# Patient Record
Sex: Female | Born: 1970 | Race: White | Hispanic: No | Marital: Married | Smoking: Former smoker
Health system: Southern US, Community
[De-identification: ages and names within clinical notes are randomized; demographics above are authoritative.]

## PROBLEM LIST (undated history)

## (undated) DIAGNOSIS — I10 Essential (primary) hypertension: Secondary | ICD-10-CM

## (undated) DIAGNOSIS — A6000 Herpesviral infection of urogenital system, unspecified: Secondary | ICD-10-CM

## (undated) DIAGNOSIS — E781 Pure hyperglyceridemia: Secondary | ICD-10-CM

## (undated) DIAGNOSIS — D1800 Hemangioma unspecified site: Secondary | ICD-10-CM

## (undated) DIAGNOSIS — K760 Fatty (change of) liver, not elsewhere classified: Secondary | ICD-10-CM

## (undated) HISTORY — DX: Fatty (change of) liver, not elsewhere classified: K76.0

## (undated) HISTORY — DX: Herpesviral infection of urogenital system, unspecified: A60.00

## (undated) HISTORY — DX: Pure hyperglyceridemia: E78.1

## (undated) HISTORY — DX: Hemangioma unspecified site: D18.00

## (undated) HISTORY — DX: Essential (primary) hypertension: I10

## (undated) HISTORY — PX: FINGER SURGERY: SHX640

---
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
## 2006-05-04 HISTORY — PX: TUBAL LIGATION: SHX77

## 2009-03-12 ENCOUNTER — Ambulatory Visit: Payer: Self-pay | Admitting: Unknown Physician Specialty

## 2009-10-27 LAB — HM PAP SMEAR: HM Pap smear: NORMAL

## 2010-07-18 ENCOUNTER — Ambulatory Visit: Payer: Self-pay | Admitting: Unknown Physician Specialty

## 2011-01-12 ENCOUNTER — Encounter: Payer: Self-pay | Admitting: Internal Medicine

## 2011-01-12 ENCOUNTER — Ambulatory Visit (INDEPENDENT_AMBULATORY_CARE_PROVIDER_SITE_OTHER): Payer: 59 | Admitting: Internal Medicine

## 2011-01-12 VITALS — BP 129/86 | HR 74 | Temp 98.6°F | Ht 67.0 in | Wt 232.5 lb

## 2011-01-12 DIAGNOSIS — R591 Generalized enlarged lymph nodes: Secondary | ICD-10-CM

## 2011-01-12 DIAGNOSIS — E669 Obesity, unspecified: Secondary | ICD-10-CM

## 2011-01-12 DIAGNOSIS — Z0000 Encounter for general adult medical examination without abnormal findings: Secondary | ICD-10-CM

## 2011-01-12 DIAGNOSIS — H60399 Other infective otitis externa, unspecified ear: Secondary | ICD-10-CM

## 2011-01-12 DIAGNOSIS — R599 Enlarged lymph nodes, unspecified: Secondary | ICD-10-CM

## 2011-01-12 DIAGNOSIS — H6090 Unspecified otitis externa, unspecified ear: Secondary | ICD-10-CM

## 2011-01-12 MED ORDER — CIPROFLOXACIN-DEXAMETHASONE 0.3-0.1 % OT SUSP: 4.0000 [drp] | Freq: Two times a day (BID) | OTIC | Status: AC

## 2011-01-12 NOTE — Patient Instructions (Signed)
Lymphadenopathy (Swollen Lymph Glands) Lymphadenopathy means "disease of the lymph glands." But the term is usually used to describe swollen or enlarged lymph glands, also called lymph nodes. These are the bean-shaped organs found in many locations including the neck, underarm, and groin. Lymph glands are part of the immune system, which fights infections in your body. Lymphadenopathy can occur in just one area of the body, such as the neck, or it can be generalized, with lymph node enlargement in several areas. The nodes found in the neck are the most common sites of lymphadenopathy. CAUSES  When your immune system responds to germs (such as viruses or bacteria ), infection-fighting cells and fluid build up. This causes the glands to grow in size. This is usually not something to worry about. Sometimes, the glands themselves can become infected and inflamed. This is called lymphadenitis. Enlarged lymph nodes can be caused by many diseases:  Bacterial disease, such as strep throat or a skin infection.   Viral disease, such as a common cold.   Other germs, such as lyme disease, tuberculosis, or sexually transmitted diseases.   Cancers, such as lymphoma (cancer of the lymphatic system) or leukemia (cancer of the white blood cells).   Inflammatory diseases such as lupus or rheumatoid arthritis.   Reactions to medications.  Many of the diseases above are rare, but important. This is why you should see your caregiver if you have lymphadenopathy. SYMPTOMS  Swollen, enlarged lumps in the neck, back of the head or other locations.   Tenderness.   Warmth or redness of the skin over the lymph nodes.   Fever.  DIAGNOSIS Enlarged lymph nodes are often near the source of infection. They can help healthcare providers diagnose your illness. For instance:   Swollen lymph nodes around the jaw might be caused by an infection in the mouth.   Enlarged glands in the neck often signal a throat infection.     Lymph nodes that are swollen in more than one area often indicate an illness caused by a virus.  Your caregiver most likely will know what is causing your lymphadenopathy after listening to your history and examining you. Blood tests, x-rays or other tests may be needed. If the cause of the enlarged lymph node cannot be found, and it does not go away by itself, then a biopsy may be needed. Your caregiver will discuss this with you. TREATMENT Treatment for your enlarged lymph nodes will depend on the cause. Many times the nodes will shrink to normal size by themselves, with no treatment. Antibiotics or other medicines may be needed for infection. Only take over-the-counter or prescription medicines for pain, discomfort or fever as directed by your caregiver. HOME CARE INSTRUCTIONS Swollen lymph glands usually return to normal when the underlying medical condition goes away. If they persist, contact your health-care provider. He/she might prescribe antibiotics or other treatments, depending on the diagnosis. Take any medications exactly as prescribed. Keep any follow-up appointments made to check on the condition of your enlarged nodes.  SEEK MEDICAL CARE IF:  Swelling lasts for more than two weeks.   You have symptoms such as weight loss, night sweats, fatigue or fever that does not go away.   The lymph nodes are hard, seem fixed to the skin or are growing rapidly.   Skin over the lymph nodes is red and inflamed. This could mean there is an infection.  SEEK IMMEDIATE MEDICAL CARE IF:  Fluid starts leaking from the area of the enlarged lymph   node.   You develop a fever of 102 F (38.9 C) or greater.   Severe pain develops (not necessarily at the site of a large lymph node).   You develop chest pain or shortness of breath.   You develop worsening abdominal pain.  MAKE SURE YOU:   Understand these instructions.   Will watch your condition.   Will get help right away if you are not  doing well or get worse.  Document Released: 01/28/2008 Document Re-Released: 05/12/2009 [employer] Patient Information 2011 [employer]. 

## 2011-01-12 NOTE — Progress Notes (Signed)
Subjective:    Patient ID: Rhonda Kirby, female    DOB: [birth year], 40 y.o.   MRN: [id number]  HPI Rhonda Kirby is a 40 year old female who presents for an acute visit complaining of right neck lymphadenopathy. She reports that she noticed a swelling in her right neck last Thursday. She reports that this area became very tender to touch on Friday. She reports that all day Friday she felt poorly with general malaise, however she had no specific symptoms such as sore throat, ear pain, fever, cough. Over the weekend, she felt much better. She reports that the lymph node gradually decreased in size. She can no longer palpate the lymph node. She denies any other symptoms such as night sweats, weight loss, fatigue.  She is concerned over inability to lose weight. She notes difficulty losing weight since the birth of her last child 4 years ago. She reports attempts at weight loss including using weight watchers program. She reports some limited physical activity walking. She notes limitation on her free time for exercise because of responsibilities both at work and caring for her children.  Outpatient Encounter Prescriptions as of 01/12/2011  Medication Sig Dispense Refill  . ciprofloxacin-dexamethasone (CIPRODEX) otic suspension Place 4 drops into the right ear 2 (two) times daily.  7.5 mL  0    Review of Systems  Constitutional: Negative for fever, chills, appetite change, fatigue and unexpected weight change.  HENT: Positive for ear pain (itching right ear canal) and ear discharge. Negative for hearing loss, nosebleeds, congestion, sore throat, facial swelling, rhinorrhea, sneezing, mouth sores, trouble swallowing, neck pain, neck stiffness, voice change, postnasal drip, sinus pressure and tinnitus.   Eyes: Negative for pain, discharge, redness and visual disturbance.  Respiratory: Negative for cough, chest tightness, shortness of breath, wheezing and stridor.   Cardiovascular: Negative for chest pain,  palpitations and leg swelling.  Gastrointestinal: Negative for nausea, vomiting, abdominal pain, diarrhea and constipation.  Genitourinary: Negative for dysuria and flank pain.  Musculoskeletal: Negative for myalgias, arthralgias and gait problem.  Skin: Negative for color change and rash.  Neurological: Negative for dizziness, weakness, light-headedness and headaches.  Hematological: Positive for adenopathy (right anterior neck, now resolved). Does not bruise/bleed easily.  Psychiatric/Behavioral: Negative for suicidal ideas, sleep disturbance and dysphoric mood. The patient is not nervous/anxious.    BP 129/86  Pulse 74  Temp(Src) 98.6 F (37 C) (Oral)  Ht 5\' 7"  (1.702 m)  Wt 232 lb 8 oz (105.461 kg)  BMI 36.41 kg/m2  SpO2 99%  LMP 12/18/2010     Objective:   Physical Exam  Constitutional: She is oriented to person, place, and time. She appears well-developed and well-nourished. No distress.  HENT:  Head: Normocephalic and atraumatic.  Right Ear: Hearing and tympanic membrane normal. There is drainage.  Left Ear: Hearing, tympanic membrane, external ear and ear canal normal.  Nose: Nose normal.  Mouth/Throat: Oropharynx is clear and moist. No oropharyngeal exudate or posterior oropharyngeal erythema.  Eyes: Conjunctivae are normal. Pupils are equal, round, and reactive to light. Right eye exhibits no discharge. Left eye exhibits no discharge. No scleral icterus.  Neck: Normal range of motion. Neck supple. No tracheal deviation present. No thyromegaly present.  Cardiovascular: Normal rate, regular rhythm, normal heart sounds and intact distal pulses.  Exam reveals no gallop and no friction rub.   No murmur heard. Pulmonary/Chest: Effort normal and breath sounds normal. No respiratory distress. She has no wheezes. She has no rales. She exhibits no tenderness.  Musculoskeletal:  Normal range of motion. She exhibits no edema and no tenderness.  Lymphadenopathy:       Head (right  side): No submental, no submandibular, no preauricular, no posterior auricular and no occipital adenopathy present.       Head (left side): No submental, no submandibular, no tonsillar, no preauricular, no posterior auricular and no occipital adenopathy present.    She has cervical adenopathy.       Right cervical: Superficial cervical adenopathy present. No deep cervical and no posterior cervical adenopathy present.      Left cervical: No superficial cervical, no deep cervical and no posterior cervical adenopathy present.    She has no axillary adenopathy.       Soft, freely mobile LAD over right SCM in anterior cervical chain  Neurological: She is alert and oriented to person, place, and time. No cranial nerve deficit. She exhibits normal muscle tone. Coordination normal.  Skin: Skin is warm and dry. No rash noted. She is not diaphoretic. No erythema. No pallor.  Psychiatric: She has a normal mood and affect. Her behavior is normal. Judgment and thought content normal.          Assessment & Plan:  1. Right anterior cervical LAD - patient with recent history of right anterior cervical lymphadenopathy, which by her report seems to be improving. Suspect this was related to viral infection giving her symptoms on Friday. Exam today is remarkable only for a single subcentimeter node in anterior cervical chain which is palpable just superficial to the sternocleidomastoid. We'll continue to monitor at this lymph node. If it increases in size, becomes painful, or she develops new symptoms such as fever, night sweats, or malaise we will plan to investigate further with a CT of the neck were lymph node biopsy.  2. Otitis externa - patient has several month history of drainage and ear rotation in her right ear canal. Exam today is remarkable for erythema and ear canal with some purulent drainage. Will try treating with Ciprodex. If she has no improvement with this over the next week then we will set her up  with ENT for culture and evaluation.  3. Obesity - patient notes inability to lose weight. BMI elevated at 36. We discussed keeping a food diary. We also discussed limiting calories to 1200 per day. We discussed increasing intake of whole foods, including fruits and vegetables. I gave her some references for healthy recipes. We will plan to repeat her lab work including cholesterol in February 2012. She plans to make a concerted effort at diet and exercise prior to her physical exam in February.

## 2011-02-11 ENCOUNTER — Encounter: Payer: Self-pay | Admitting: Internal Medicine

## 2011-02-24 ENCOUNTER — Ambulatory Visit (INDEPENDENT_AMBULATORY_CARE_PROVIDER_SITE_OTHER): Payer: 59 | Admitting: Internal Medicine

## 2011-02-24 ENCOUNTER — Encounter: Payer: Self-pay | Admitting: Internal Medicine

## 2011-02-24 VITALS — BP 122/78 | HR 69 | Temp 98.6°F | Resp 10

## 2011-02-24 DIAGNOSIS — J329 Chronic sinusitis, unspecified: Secondary | ICD-10-CM

## 2011-02-24 MED ORDER — AMOXICILLIN-POT CLAVULANATE 875-125 MG PO TABS: 1.0000 | ORAL_TABLET | Freq: Two times a day (BID) | ORAL | Status: AC

## 2011-02-24 NOTE — Progress Notes (Signed)
Subjective:    Patient ID: Rhonda Kirby, female    DOB: 1970-08-25, 40 y.o.   MRN: [id number]  HPI Rhonda Kirby is a 40 year old female who presents for an acute visit complaining of ear pain and nasal congestion. She notes that her symptoms began approximately 2 weeks ago. She reports nasal drainage and facial pressure. She denies any fever or chills. She has tried using over-the-counter decongestants and pain medicines with no improvement. She occasionally has some dizziness described as vertigo. She has several sick contacts.  Outpatient Encounter Prescriptions as of 02/24/2011  Medication Sig Dispense Refill  . amoxicillin-clavulanate (AUGMENTIN) 875-125 MG per tablet Take 1 tablet by mouth 2 (two) times daily.  20 tablet  0    Review of Systems  Constitutional: Negative for fever, chills, appetite change, fatigue and unexpected weight change.  HENT: Positive for ear pain, congestion and sinus pressure. Negative for sore throat, trouble swallowing, neck pain, voice change and ear discharge.   Eyes: Negative for visual disturbance.  Respiratory: Negative for cough, shortness of breath, wheezing and stridor.   Cardiovascular: Negative for chest pain, palpitations and leg swelling.  Gastrointestinal: Negative for nausea, vomiting, abdominal pain, diarrhea, constipation, blood in stool, abdominal distention and anal bleeding.  Musculoskeletal: Negative for myalgias, arthralgias and gait problem.  Skin: Negative for color change and rash.  Neurological: Positive for dizziness and headaches.  Hematological: Negative for adenopathy. Does not bruise/bleed easily.  Psychiatric/Behavioral: Negative for suicidal ideas, sleep disturbance and dysphoric mood. The patient is not nervous/anxious.    BP 122/78  Pulse 69  Temp 98.6 F (37 C)  Resp 10  SpO2 97%     Objective:   Physical Exam  Constitutional: She is oriented to person, place, and time. She appears well-developed and well-nourished.  No distress.  HENT:  Head: Normocephalic and atraumatic.  Right Ear: External ear normal. Tympanic membrane is not erythematous. A middle ear effusion is present.  Left Ear: External ear normal. Tympanic membrane is not erythematous.  Nose: Nose normal. Right sinus exhibits no maxillary sinus tenderness and no frontal sinus tenderness. Left sinus exhibits no maxillary sinus tenderness and no frontal sinus tenderness.  Mouth/Throat: Oropharynx is clear and moist. No oropharyngeal exudate or posterior oropharyngeal erythema.  Eyes: Conjunctivae are normal. Pupils are equal, round, and reactive to light. Right eye exhibits no discharge. Left eye exhibits no discharge. No scleral icterus.  Neck: Normal range of motion. Neck supple. No tracheal deviation present. No thyromegaly present.  Cardiovascular: Normal rate, regular rhythm, normal heart sounds and intact distal pulses.  Exam reveals no gallop and no friction rub.   No murmur heard. Pulmonary/Chest: Effort normal and breath sounds normal. No respiratory distress. She has no wheezes. She has no rales. She exhibits no tenderness.  Musculoskeletal: Normal range of motion. She exhibits no edema and no tenderness.  Lymphadenopathy:    She has no cervical adenopathy.  Neurological: She is alert and oriented to person, place, and time. No cranial nerve deficit. She exhibits normal muscle tone. Coordination normal.  Skin: Skin is warm and dry. No rash noted. She is not diaphoretic. No erythema. No pallor.  Psychiatric: She has a normal mood and affect. Her behavior is normal. Judgment and thought content normal.          Assessment & Plan:  1. Sinusitis - symptoms are most consistent with acute maxillary sinusitis. Will treat with Augmentin x10 days. She will continue to use ibuprofen to help with inflammation and pain.  Patient will call or return to clinic if symptoms are not improving over the next week.

## 2011-03-05 ENCOUNTER — Ambulatory Visit: Payer: 59 | Admitting: Internal Medicine

## 2011-03-11 ENCOUNTER — Encounter: Payer: Self-pay | Admitting: Internal Medicine

## 2011-03-11 ENCOUNTER — Ambulatory Visit (INDEPENDENT_AMBULATORY_CARE_PROVIDER_SITE_OTHER): Payer: 59 | Admitting: Internal Medicine

## 2011-03-11 VITALS — BP 132/62 | HR 79 | Temp 98.4°F | Wt 232.0 lb

## 2011-03-11 DIAGNOSIS — F419 Anxiety disorder, unspecified: Secondary | ICD-10-CM

## 2011-03-11 DIAGNOSIS — F411 Generalized anxiety disorder: Secondary | ICD-10-CM

## 2011-03-11 DIAGNOSIS — N920 Excessive and frequent menstruation with regular cycle: Secondary | ICD-10-CM

## 2011-03-11 MED ORDER — ALPRAZOLAM 0.25 MG PO TABS: 0.2500 mg | ORAL_TABLET | Freq: Three times a day (TID) | ORAL | Status: AC | PRN

## 2011-03-11 NOTE — Patient Instructions (Signed)
[facility] - Uterine ablation  Tad Moore - Relaxation Expert

## 2011-03-11 NOTE — Progress Notes (Signed)
Subjective:    Patient ID: Rhonda Kirby, female    DOB: 06-21-1970, 40 y.o.   MRN: [id number]  HPI 40 year old female presents for an acute visit with a concern of anxiety. She notes that over the course of her life she has had a couple of episodes of anxiety. First, during puberty and then secondly when she was pregnant with her children. She notes that during her pregnancy she used Zoloft with significant improvement in her anxiety. She reports that over the last few weeks she has developed episodes of panic attacks which have woken her from her sleep. During these episodes she feels tightness in her chest and sense of extreme anxiety and nervousness. She has been trying to control the episodes with use of meditation and relaxation techniques. She notes significant anxiety that she will have recurrent attacks. She denies any recent changes at home or at work to precipitate these events. She notes some dysphoric mood but describes as mild. She has been able to complete her activities of daily living and is functioning normally. She questions whether this recent increase in anxiety may be related to hormonal changes associated with menopause. She notes that her periods continue to be regular however they are significantly heavier than previously.  Outpatient Encounter Prescriptions as of 03/11/2011  Medication Sig Dispense Refill  . ALPRAZolam (XANAX) 0.25 MG tablet Take 1 tablet (0.25 mg total) by mouth 3 (three) times daily as needed for anxiety.  90 tablet  0     Review of Systems  Constitutional: Negative for fever, chills, appetite change, fatigue and unexpected weight change.  HENT: Negative for ear pain, congestion, sore throat, neck pain and sinus pressure.   Eyes: Negative for visual disturbance.  Respiratory: Negative for cough and shortness of breath.   Cardiovascular: Negative for chest pain, palpitations and leg swelling.  Genitourinary: Negative for dysuria and flank pain.    Musculoskeletal: Negative for myalgias, arthralgias and gait problem.  Skin: Negative for color change and rash.  Neurological: Negative for dizziness and headaches.  Hematological: Negative for adenopathy. Does not bruise/bleed easily.  Psychiatric/Behavioral: Negative for suicidal ideas, sleep disturbance and dysphoric mood. The patient is nervous/anxious.    BP 132/62  Pulse 79  Temp(Src) 98.4 F (36.9 C) (Oral)  Wt 232 lb (105.235 kg)  SpO2 98%     Objective:   Physical Exam  Constitutional: She is oriented to person, place, and time. She appears well-developed and well-nourished. No distress.  HENT:  Head: Normocephalic and atraumatic.  Right Ear: Tympanic membrane and external ear normal.  Left Ear: Tympanic membrane and external ear normal.  Nose: Nose normal.  Mouth/Throat: Oropharynx is clear and moist. No oropharyngeal exudate.  Eyes: Conjunctivae are normal. Pupils are equal, round, and reactive to light. Right eye exhibits no discharge. Left eye exhibits no discharge. No scleral icterus.  Neck: Normal range of motion. Neck supple. No tracheal deviation present. No thyromegaly present.  Cardiovascular: Normal rate, regular rhythm, normal heart sounds and intact distal pulses.  Exam reveals no gallop and no friction rub.   No murmur heard. Pulmonary/Chest: Effort normal and breath sounds normal. No respiratory distress. She has no wheezes. She has no rales. She exhibits no tenderness.  Musculoskeletal: Normal range of motion. She exhibits no edema and no tenderness.  Lymphadenopathy:    She has no cervical adenopathy.  Neurological: She is alert and oriented to person, place, and time. No cranial nerve deficit. She exhibits normal muscle tone. Coordination normal.  Skin: Skin is warm and dry. No rash noted. She is not diaphoretic. No erythema. No pallor.  Psychiatric: She has a normal mood and affect. Her behavior is normal. Judgment and thought content normal.           Assessment & Plan:  1. Anxiety/Panic Attacks - Will start alprazolam as needed. Pt will call if symptoms not controlled with use of prn alprazolam. We discussed use of SSRIs, but will hold off on starting for now.  Gave some references for relaxation. Follow up 1 month.  2. Menorrhagia - Discussed intervention including novosure. She will discuss this with OB.

## 2011-04-14 ENCOUNTER — Ambulatory Visit: Payer: 59 | Admitting: Internal Medicine

## 2011-06-24 ENCOUNTER — Telehealth: Payer: Self-pay | Admitting: Internal Medicine

## 2011-06-24 DIAGNOSIS — Z1231 Encounter for screening mammogram for malignant neoplasm of breast: Secondary | ICD-10-CM

## 2011-06-24 NOTE — Telephone Encounter (Signed)
[phone]  Pt called her ob is retiring who would you recommend for her to go to  She also needs order to go to [facility] for mammogram dr Jonni Sanger usually orders this

## 2011-06-25 NOTE — Telephone Encounter (Signed)
We would be happy to see her for PAP smears, or I would recommend Dr. Greggory Keen at [facility].  We can order screening mammogram.

## 2011-06-29 ENCOUNTER — Other Ambulatory Visit: Payer: 59

## 2011-06-29 IMAGING — US ULTRASOUND RIGHT BREAST
1 series · 8 of 8 positions shown · non-contrast
Comparison: none

REASON FOR EXAM: right breast pain at 11 oclock    baseline
COMMENTS:

PROCEDURE:     US  - US BREAST RIGHT  - March 12, 2009  [DATE]
RESULT:     No cystic or solid abnormalities are identified. A negative
mammogram and ultrasound does not preclude biopsy if a palpable lesion is
present.

[Series 1: ultrasound right breast · 8 of 8 slices shown]
[im 1/8]
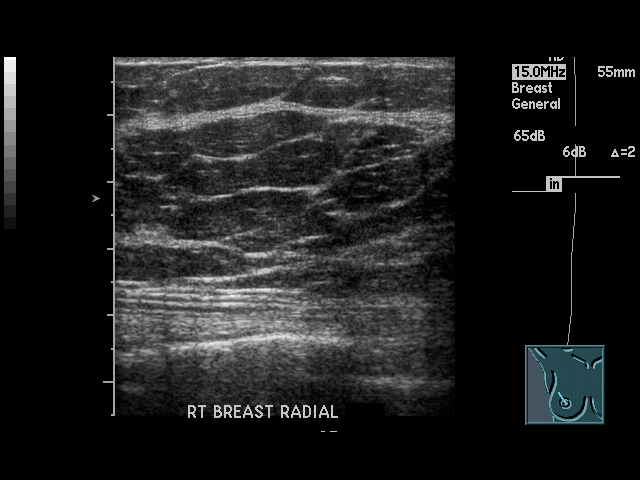
[im 2/8]
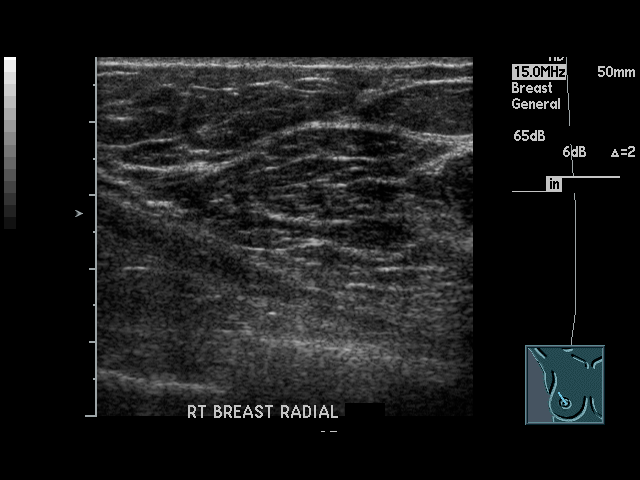
[im 3/8]
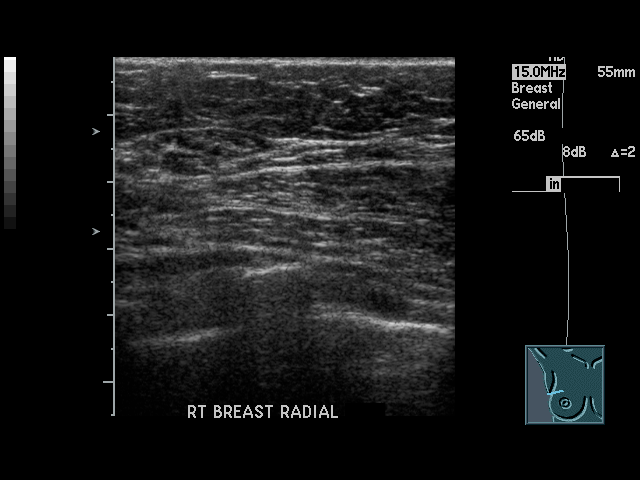
[im 4/8]
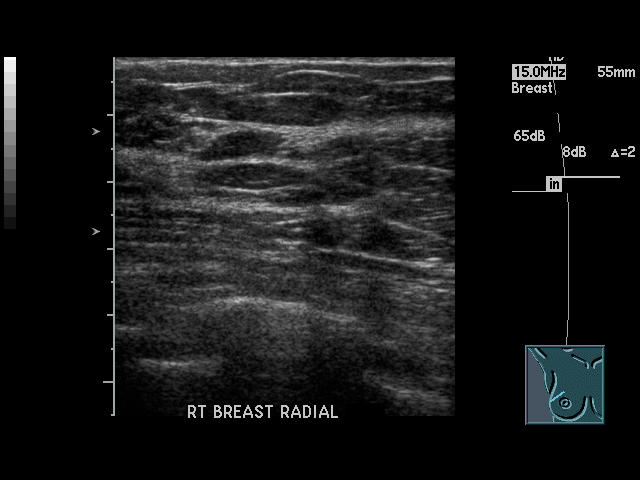
[im 5/8]
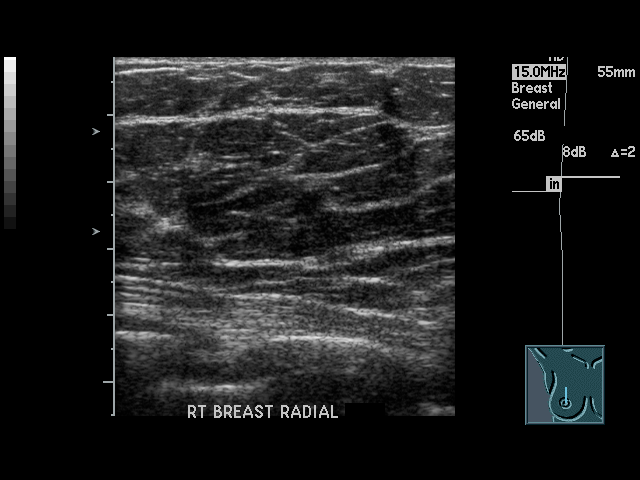
[im 6/8]
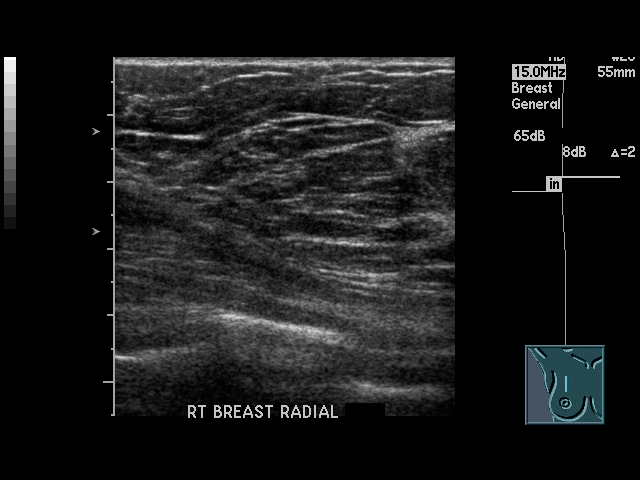
[im 7/8]
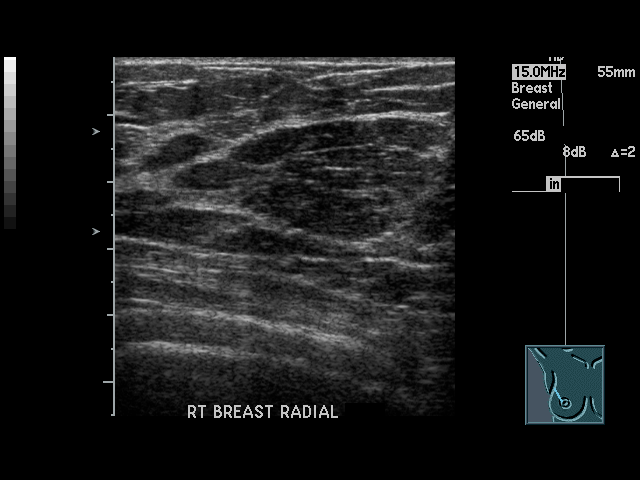
[im 8/8]
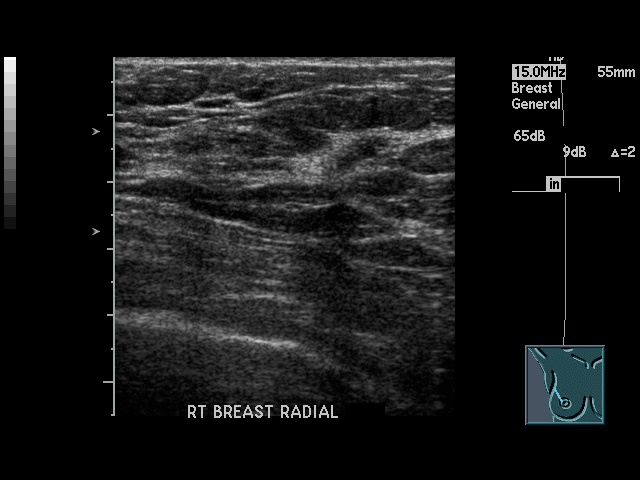

[8 of 8 positions shown; findings below may reference images not displayed]

IMPRESSION: BI-RADS: Category 2 - Benign Findings

## 2011-06-29 NOTE — Telephone Encounter (Signed)
Left mess to call office back.  Mammogram ordered

## 2011-07-06 ENCOUNTER — Ambulatory Visit (INDEPENDENT_AMBULATORY_CARE_PROVIDER_SITE_OTHER): Payer: 59 | Admitting: Internal Medicine

## 2011-07-06 ENCOUNTER — Encounter: Payer: Self-pay | Admitting: Internal Medicine

## 2011-07-06 DIAGNOSIS — M722 Plantar fascial fibromatosis: Secondary | ICD-10-CM | POA: Insufficient documentation

## 2011-07-06 DIAGNOSIS — B351 Tinea unguium: Secondary | ICD-10-CM | POA: Insufficient documentation

## 2011-07-06 DIAGNOSIS — Z0000 Encounter for general adult medical examination without abnormal findings: Secondary | ICD-10-CM | POA: Insufficient documentation

## 2011-07-06 NOTE — Assessment & Plan Note (Signed)
Exam normal today including breast exam. PAP UTD 2012.  Will request records on previous PAPs.  Labs ordered today including CBC, CMP, lipids.  Encouraged efforts at healthy diet and regular physical activity.  Follow up 1 year and prn.

## 2011-07-06 NOTE — Assessment & Plan Note (Signed)
Right thumb nail. Will continue topical anti-fungal for now.  We discussed used of po antifungals, but would prefer to defer for now.  Pt will call if not resolved over next few weeks.

## 2011-07-06 NOTE — Assessment & Plan Note (Signed)
Encouraged icing of area and use of ibuprofen 800mg  po tidprn.  If no improvement, then will set up referral to ortho for evaluation.

## 2011-07-06 NOTE — Telephone Encounter (Signed)
Pt seen in office today.

## 2011-07-06 NOTE — Progress Notes (Signed)
Subjective:    Patient ID: Rhonda Kirby, female    DOB: 1970/06/12, 41 y.o.   MRN: [id number]  HPI 41YO female presents for annual exam.  She was recently started on xanax to help with episodes of anxiety.  She notes improvement in anxiety and in ability to sleep after taking this medicine. She does not take the medicine every day.  She has not noted any side effects such as morning drowsiness.  She is concerned today about breakage of her right thumbnail, which she attributes to fungal infection.  She has been applying a topical anti-fungal preparation with some improvement.    She also notes several week h/o right mid foot pain. She denies injury to her foot.  She notes that pain is worse with walking up stairs, and seems to radiate up to her right knee.  She has not been taking any medication for this.    In regards to health maintenance, she notes that PAP is UTD and was normal in 2012.  She is due for mammogram.  Outpatient Encounter Prescriptions as of 07/06/2011  Medication Sig Dispense Refill  . ALPRAZolam (XANAX) 0.25 MG tablet Take 1 tablet (0.25 mg total) by mouth 3 (three) times daily as needed for anxiety.  90 tablet  0    Review of Systems  Constitutional: Negative for fever, chills, appetite change, fatigue and unexpected weight change.  HENT: Negative for ear pain, congestion, sore throat, trouble swallowing, neck pain, voice change and sinus pressure.   Eyes: Negative for visual disturbance.  Respiratory: Negative for cough, shortness of breath, wheezing and stridor.   Cardiovascular: Negative for chest pain, palpitations and leg swelling.  Gastrointestinal: Negative for nausea, vomiting, abdominal pain, diarrhea, constipation, blood in stool, abdominal distention and anal bleeding.  Genitourinary: Negative for dysuria and flank pain.  Musculoskeletal: Positive for myalgias and arthralgias. Negative for gait problem.  Skin: Negative for color change and rash.    Neurological: Negative for dizziness and headaches.  Hematological: Negative for adenopathy. Does not bruise/bleed easily.  Psychiatric/Behavioral: Negative for suicidal ideas, sleep disturbance and dysphoric mood. The patient is not nervous/anxious.    BP 140/82  Pulse 75  Temp(Src) 97.9 F (36.6 C) (Oral)  Wt 237 lb (107.502 kg)  SpO2 99%     Objective:   Physical Exam  Constitutional: She is oriented to person, place, and time. She appears well-developed and well-nourished. No distress.  HENT:  Head: Normocephalic and atraumatic.  Right Ear: External ear normal.  Left Ear: External ear normal.  Nose: Nose normal.  Mouth/Throat: Oropharynx is clear and moist. No oropharyngeal exudate.  Eyes: Conjunctivae are normal. Pupils are equal, round, and reactive to light. Right eye exhibits no discharge. Left eye exhibits no discharge. No scleral icterus.  Neck: Normal range of motion. Neck supple. No tracheal deviation present. No thyromegaly present.  Cardiovascular: Normal rate, regular rhythm, normal heart sounds and intact distal pulses.  Exam reveals no gallop and no friction rub.   No murmur heard. Pulmonary/Chest: Effort normal and breath sounds normal. No respiratory distress. She has no wheezes. She has no rales. She exhibits no tenderness. Right breast exhibits no inverted nipple, no mass, no nipple discharge, no skin change and no tenderness. Left breast exhibits no inverted nipple, no mass, no nipple discharge, no skin change and no tenderness. Breasts are symmetrical.  Abdominal: Soft. Bowel sounds are normal. She exhibits no distension and no mass. There is no tenderness. There is no rebound and no guarding.  Musculoskeletal: Normal range of motion. She exhibits no edema and no tenderness.       Right knee: Normal. She exhibits normal range of motion and no swelling.       Right foot: She exhibits tenderness.       Feet:  Lymphadenopathy:    She has no cervical adenopathy.   Neurological: She is alert and oriented to person, place, and time. No cranial nerve deficit. She exhibits normal muscle tone. Coordination normal.  Skin: Skin is warm and dry. No rash noted. She is not diaphoretic. No erythema. No pallor.     Psychiatric: She has a normal mood and affect. Her behavior is normal. Judgment and thought content normal.          Assessment & Plan:

## 2011-07-07 LAB — COMPREHENSIVE METABOLIC PANEL
ALT: 42 U/L — ABNORMAL HIGH (ref 0–35)
Albumin: 4.3 g/dL (ref 3.5–5.2)
CO2: 23 mEq/L (ref 19–32)
Calcium: 9.2 mg/dL (ref 8.4–10.5)
Chloride: 102 mEq/L (ref 96–112)
Creatinine, Ser: 0.7 mg/dL (ref 0.4–1.2)
GFR: 91.89 mL/min (ref >60.00)
Sodium: 140 mEq/L (ref 135–145)
Total Protein: 7 g/dL (ref 6.0–8.3)

## 2011-07-07 LAB — LIPID PANEL
Cholesterol: 154 mg/dL (ref 0–200)
HDL: 36.8 mg/dL — ABNORMAL LOW (ref >39.00)
Total CHOL/HDL Ratio: 4
Triglycerides: 342 mg/dL — ABNORMAL HIGH (ref 0.0–149.0)

## 2011-07-07 LAB — CBC WITH DIFFERENTIAL/PLATELET
Basophils Relative: 0.2 % (ref 0.0–3.0)
Eosinophils Absolute: 0.2 10*3/uL (ref 0.0–0.7)
Eosinophils Relative: 3.1 % (ref 0.0–5.0)
Lymphocytes Relative: 33.7 % (ref 12.0–46.0)
Neutrophils Relative %: 57.6 % (ref 43.0–77.0)
Platelets: 249 10*3/uL (ref 150.0–400.0)
RBC: 4.75 Mil/uL (ref 3.87–5.11)
WBC: 6.7 10*3/uL (ref 4.5–10.5)

## 2011-08-25 ENCOUNTER — Ambulatory Visit: Payer: Self-pay | Admitting: Internal Medicine

## 2011-08-28 ENCOUNTER — Encounter: Payer: Self-pay | Admitting: Internal Medicine

## 2011-12-29 ENCOUNTER — Encounter: Payer: Self-pay | Admitting: Internal Medicine

## 2011-12-29 ENCOUNTER — Ambulatory Visit (INDEPENDENT_AMBULATORY_CARE_PROVIDER_SITE_OTHER): Payer: 59 | Admitting: Internal Medicine

## 2011-12-29 VITALS — BP 144/100 | HR 100 | Temp 98.7°F | Ht 67.0 in | Wt 235.5 lb

## 2011-12-29 DIAGNOSIS — B351 Tinea unguium: Secondary | ICD-10-CM

## 2011-12-29 DIAGNOSIS — L609 Nail disorder, unspecified: Secondary | ICD-10-CM

## 2011-12-29 NOTE — Assessment & Plan Note (Signed)
Thumb nail changes most consistent with fungal infection. Given persistence of symptoms, will set up a dermatology for evaluation and culture. 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 Followup as needed.

## 2011-12-29 NOTE — Progress Notes (Signed)
  Subjective:    Patient ID: Rhonda Kirby, female    DOB: 1971-04-05, 41 y.o.   MRN: [id number]  HPI 41 year old female presents for acute visit complaining of a lesion on her left thumbnail. This has been present for several months. She has tried using topical antifungal creams with resolution of her symptoms however symptoms typically recur within a couple of weeks. The lateral edge of her left thumbnail is white and sometimes purulent. The area around the left lateral thumbnail is sometimes tender to touch. She denies any redness or drainage.  Outpatient Encounter Prescriptions as of 12/29/2011  Medication Sig Dispense Refill  . ALPRAZolam (XANAX) 0.25 MG tablet Take 1 tablet (0.25 mg total) by mouth 3 (three) times daily as needed for anxiety.  90 tablet  0   BP 144/100  Pulse 100  Temp 98.7 F (37.1 C) (Oral)  Ht 5\' 7"  (1.702 m)  Wt 235 lb 8 oz (106.822 kg)  BMI 36.88 kg/m2  SpO2 98%  LMP 12/04/2011  Review of Systems  Constitutional: Negative for fever, chills and fatigue.  Skin: Positive for color change and wound.       Objective:   Physical Exam  Constitutional: She appears well-developed and well-nourished. No distress.  HENT:  Head: Normocephalic and atraumatic.  Eyes: EOM are normal.  Neck: Normal range of motion.  Pulmonary/Chest: Effort normal.  Skin: She is not diaphoretic.             Assessment & Plan:

## 2012-01-05 ENCOUNTER — Encounter: Payer: Self-pay | Admitting: Internal Medicine

## 2012-01-05 MED ORDER — AMOXICILLIN-POT CLAVULANATE 875-125 MG PO TABS: 1.0000 | ORAL_TABLET | Freq: Two times a day (BID) | ORAL | Status: AC

## 2012-01-06 ENCOUNTER — Encounter: Payer: Self-pay | Admitting: Internal Medicine

## 2012-01-06 MED ORDER — LEVOFLOXACIN 750 MG PO TABS: 750.0000 mg | ORAL_TABLET | Freq: Every day | ORAL | Status: AC

## 2012-01-07 ENCOUNTER — Encounter: Payer: Self-pay | Admitting: Internal Medicine

## 2012-01-13 ENCOUNTER — Encounter: Payer: Self-pay | Admitting: Internal Medicine

## 2012-01-14 ENCOUNTER — Encounter: Payer: Self-pay | Admitting: Internal Medicine

## 2012-01-14 MED ORDER — SULFAMETHOXAZOLE-TRIMETHOPRIM 800-160 MG PO TABS: 1.0000 | ORAL_TABLET | Freq: Two times a day (BID) | ORAL | Status: AC

## 2012-05-02 ENCOUNTER — Ambulatory Visit: Payer: 59 | Admitting: Internal Medicine

## 2012-06-15 ENCOUNTER — Ambulatory Visit: Payer: 59 | Admitting: Internal Medicine

## 2012-06-18 ENCOUNTER — Other Ambulatory Visit: Payer: Self-pay

## 2012-06-23 ENCOUNTER — Encounter: Payer: Self-pay | Admitting: Internal Medicine

## 2012-06-23 ENCOUNTER — Ambulatory Visit (INDEPENDENT_AMBULATORY_CARE_PROVIDER_SITE_OTHER): Payer: 59 | Admitting: Internal Medicine

## 2012-06-23 VITALS — BP 126/96 | HR 92 | Temp 98.4°F | Wt 233.0 lb

## 2012-06-23 DIAGNOSIS — B351 Tinea unguium: Secondary | ICD-10-CM

## 2012-06-23 DIAGNOSIS — Z1239 Encounter for other screening for malignant neoplasm of breast: Secondary | ICD-10-CM

## 2012-06-23 DIAGNOSIS — E669 Obesity, unspecified: Secondary | ICD-10-CM

## 2012-06-23 DIAGNOSIS — R7989 Other specified abnormal findings of blood chemistry: Secondary | ICD-10-CM

## 2012-06-23 LAB — CBC WITH DIFFERENTIAL/PLATELET
Basophils Absolute: 0 10*3/uL (ref 0.0–0.1)
Eosinophils Absolute: 0.1 10*3/uL (ref 0.0–0.7)
HCT: 42.8 % (ref 36.0–46.0)
Hemoglobin: 15 g/dL (ref 12.0–15.0)
Lymphs Abs: 1.9 10*3/uL (ref 0.7–4.0)
MCHC: 35.1 g/dL (ref 30.0–36.0)
MCV: 85.2 fl (ref 78.0–100.0)
Monocytes Absolute: 0.4 10*3/uL (ref 0.1–1.0)
Neutro Abs: 3.8 10*3/uL (ref 1.4–7.7)
Platelets: 274 10*3/uL (ref 150.0–400.0)
RDW: 12.4 % (ref 11.5–14.6)

## 2012-06-23 LAB — COMPREHENSIVE METABOLIC PANEL
Alkaline Phosphatase: 53 U/L (ref 39–117)
BUN: 14 mg/dL (ref 6–23)
Creatinine, Ser: 0.6 mg/dL (ref 0.4–1.2)
Glucose, Bld: 94 mg/dL (ref 70–99)
Total Bilirubin: 0.9 mg/dL (ref 0.3–1.2)

## 2012-06-23 LAB — LIPID PANEL
Total CHOL/HDL Ratio: 6
VLDL: 47.6 mg/dL — ABNORMAL HIGH (ref 0.0–40.0)

## 2012-06-23 NOTE — Assessment & Plan Note (Signed)
Body mass index is 36.48 kg/(m^2).  Discussed importance of weight loss. Gave information on low glycemic index diet.  Discussed exercise, with goal of 3x per week. Follow up 4 weeks and prn.

## 2012-06-23 NOTE — Assessment & Plan Note (Signed)
Noted to have elevated ALT of 60 on check with dermatologist. Discussed potential causes of elevated LFTs. Most likely cause in my opinion would be fatty liver. Will send repeat CMP, ANA, TSH, testing for viral hepatitis.  Will get US liver for evaluation.  Gave information on low glycemic index diet. Follow up 4 weeks and prn.

## 2012-06-23 NOTE — Progress Notes (Signed)
Subjective:    Patient ID: Rhonda Kirby, female    DOB: 10/16/1970, 42 y.o.   MRN: [id number]  HPI 42 year old female presents for acute visit complaining of elevated liver function test which were seen by dermatologist on recent evaluation. She notes that ALT was elevated at 60. She denies any previous history of liver disease. She denies any exposure to hepatitis. She does not drink alcohol.  She does have a history of elevated triglycerides. Denies any recent change in appetite, nausea, vomiting, abdominal pain, change in bowel habits.  Evaluation was part of workup prior to starting Lamisil for toenail fungus. She has been told she is not able to use this medication because of elevated liver function tests. She would like to discuss other options.  Outpatient Encounter Prescriptions as of 06/23/2012  Medication Sig Dispense Refill  . ALPRAZolam (XANAX) 0.25 MG tablet Take 0.25 mg by mouth at bedtime as needed for sleep.      Marland Kitchen ibuprofen (ADVIL,MOTRIN) 200 MG tablet Take 200 mg by mouth every 6 (six) hours as needed for pain.       No facility-administered encounter medications on file as of 06/23/2012.   BP 126/96  Pulse 92  Temp(Src) 98.4 F (36.9 C) (Oral)  Wt 233 lb (105.688 kg)  BMI 36.48 kg/m2  SpO2 96%  LMP 06/09/2012  Review of Systems  Constitutional: Negative for fever, chills, appetite change, fatigue and unexpected weight change.  HENT: Negative for ear pain, congestion, sore throat, trouble swallowing, neck pain, voice change and sinus pressure.   Eyes: Negative for visual disturbance.  Respiratory: Negative for cough, shortness of breath, wheezing and stridor.   Cardiovascular: Negative for chest pain, palpitations and leg swelling.  Gastrointestinal: Negative for nausea, vomiting, abdominal pain, diarrhea, constipation, blood in stool, abdominal distention and anal bleeding.  Genitourinary: Negative for dysuria and flank pain.  Musculoskeletal: Negative for myalgias,  arthralgias and gait problem.  Skin: Negative for color change and rash.  Neurological: Negative for dizziness and headaches.  Hematological: Negative for adenopathy. Does not bruise/bleed easily.  Psychiatric/Behavioral: Negative for suicidal ideas, sleep disturbance and dysphoric mood. The patient is not nervous/anxious.        Objective:   Physical Exam  Constitutional: She is oriented to person, place, and time. She appears well-developed and well-nourished. No distress.  HENT:  Head: Normocephalic and atraumatic.  Right Ear: External ear normal.  Left Ear: External ear normal.  Nose: Nose normal.  Mouth/Throat: Oropharynx is clear and moist. No oropharyngeal exudate.  Eyes: Conjunctivae are normal. Pupils are equal, round, and reactive to light. Right eye exhibits no discharge. Left eye exhibits no discharge. No scleral icterus.  Neck: Normal range of motion. Neck supple. No tracheal deviation present. No thyromegaly present.  Cardiovascular: Normal rate, regular rhythm, normal heart sounds and intact distal pulses.  Exam reveals no gallop and no friction rub.   No murmur heard. Pulmonary/Chest: Effort normal and breath sounds normal. No respiratory distress. She has no wheezes. She has no rales. She exhibits no tenderness.  Abdominal: Soft. Normal appearance and bowel sounds are normal. She exhibits no distension and no mass. There is no hepatosplenomegaly. There is no tenderness. There is no rebound and no guarding.  Musculoskeletal: Normal range of motion. She exhibits no edema and no tenderness.  Lymphadenopathy:    She has no cervical adenopathy.  Neurological: She is alert and oriented to person, place, and time. No cranial nerve deficit. She exhibits normal muscle tone. Coordination normal.  Skin: Skin is warm and dry. No rash noted. She is not diaphoretic. No erythema. No pallor.  Psychiatric: She has a normal mood and affect. Her behavior is normal. Judgment and thought  content normal.          Assessment & Plan:

## 2012-06-23 NOTE — Assessment & Plan Note (Signed)
Nail fungus both on thumb nail and toenail. Unable to take Lamisil because of elevated LFTs. Discussed setting up evaluation at [facility] for laser treatment.

## 2012-06-24 LAB — HEPATITIS C ANTIBODY: HCV Ab: NEGATIVE

## 2012-06-24 LAB — HEPATITIS B SURFACE ANTIGEN: Hepatitis B Surface Ag: NEGATIVE

## 2012-06-24 LAB — ANA: Anti Nuclear Antibody(ANA): NEGATIVE

## 2012-06-27 ENCOUNTER — Ambulatory Visit
Admission: RE | Admit: 2012-06-27 | Discharge: 2012-06-27 | Disposition: A | Discharge status: Home / Self Care | Payer: 59 | Source: Ambulatory Visit | Attending: Internal Medicine | Admitting: Internal Medicine

## 2012-07-25 ENCOUNTER — Encounter: Payer: Self-pay | Admitting: Internal Medicine

## 2012-07-25 ENCOUNTER — Ambulatory Visit (INDEPENDENT_AMBULATORY_CARE_PROVIDER_SITE_OTHER): Payer: 59 | Admitting: Internal Medicine

## 2012-07-25 VITALS — BP 148/100 | HR 87 | Temp 98.8°F | Wt 230.0 lb

## 2012-07-25 DIAGNOSIS — K7581 Nonalcoholic steatohepatitis (NASH): Secondary | ICD-10-CM

## 2012-07-25 DIAGNOSIS — K7689 Other specified diseases of liver: Secondary | ICD-10-CM

## 2012-07-25 DIAGNOSIS — I10 Essential (primary) hypertension: Secondary | ICD-10-CM | POA: Insufficient documentation

## 2012-07-25 DIAGNOSIS — R030 Elevated blood-pressure reading, without diagnosis of hypertension: Secondary | ICD-10-CM

## 2012-07-25 DIAGNOSIS — Z23 Encounter for immunization: Secondary | ICD-10-CM

## 2012-07-25 DIAGNOSIS — E781 Pure hyperglyceridemia: Secondary | ICD-10-CM

## 2012-07-25 DIAGNOSIS — R7989 Other specified abnormal findings of blood chemistry: Secondary | ICD-10-CM

## 2012-07-25 DIAGNOSIS — E669 Obesity, unspecified: Secondary | ICD-10-CM

## 2012-07-25 LAB — COMPREHENSIVE METABOLIC PANEL
AST: 26 U/L (ref 0–37)
Albumin: 4.4 g/dL (ref 3.5–5.2)
Alkaline Phosphatase: 53 U/L (ref 39–117)
BUN: 13 mg/dL (ref 6–23)
Calcium: 8.8 mg/dL (ref 8.4–10.5)
Chloride: 105 mEq/L (ref 96–112)
Glucose, Bld: 106 mg/dL — ABNORMAL HIGH (ref 70–99)
Potassium: 3.9 mEq/L (ref 3.5–5.1)
Sodium: 136 mEq/L (ref 135–145)
Total Protein: 7.4 g/dL (ref 6.0–8.3)

## 2012-07-25 LAB — LDL CHOLESTEROL, DIRECT: Direct LDL: 112.3 mg/dL

## 2012-07-25 LAB — LIPID PANEL
Cholesterol: 171 mg/dL (ref 0–200)
Total CHOL/HDL Ratio: 6
Triglycerides: 231 mg/dL — ABNORMAL HIGH (ref 0.0–149.0)

## 2012-07-25 NOTE — Assessment & Plan Note (Signed)
Wt Readings from Last 3 Encounters:  07/25/12 230 lb (104.327 kg)  06/23/12 233 lb (105.688 kg)  12/29/11 235 lb 8 oz (106.822 kg)   Weight is down 3 pounds from last visit. Encouraged her to continue efforts at healthy diet and regular physical activity.

## 2012-07-25 NOTE — Patient Instructions (Signed)
Please check blood pressure 1-2 times per week and email with readings.

## 2012-07-25 NOTE — Assessment & Plan Note (Signed)
BP Readings from Last 3 Encounters:  07/25/12 148/100  06/23/12 126/96  12/29/11 144/100   Blood pressure elevated today and has been elevated at previous visits. However, patient reports significant anxiety with coming to doctor's appointments. She reports blood pressure has been well-controlled at home. We'll have her continue to monitor blood pressure at home and call if consistently greater than 140/90.

## 2012-07-25 NOTE — Assessment & Plan Note (Signed)
Elevated liver function test and liver ultrasound were consistent with steatohepatitis. Discussed a low glycemic index diet with goal of weight loss. Will plan to repeat LFTs with labs today. If no improvement, discussed referral to hepatologist.

## 2012-07-25 NOTE — Progress Notes (Signed)
  Subjective:    Patient ID: Rhonda Kirby, female    DOB: [birth year], 42 y.o.   MRN: [id number]  HPI 42 year old female with history of obesity, steatohepatitis presents for followup. She reports she is generally feeling well. She has tried to adopt a low glycemic index diet and has lost 3 pounds since her last visit. She has not yet started an exercise program. No new concerns today.  Outpatient Encounter Prescriptions as of 07/25/2012  Medication Sig Dispense Refill  . ALPRAZolam (XANAX) 0.25 MG tablet Take 0.25 mg by mouth at bedtime as needed for sleep.      Marland Kitchen ibuprofen (ADVIL,MOTRIN) 200 MG tablet Take 200 mg by mouth every 6 (six) hours as needed for pain.      . milk thistle 175 MG tablet Take 175 mg by mouth daily.       No facility-administered encounter medications on file as of 07/25/2012.   BP 148/100  Pulse 87  Temp(Src) 98.8 F (37.1 C) (Oral)  Wt 230 lb (104.327 kg)  BMI 36.01 kg/m2  SpO2 97%  LMP 07/07/2012  Review of Systems  Constitutional: Negative for fever, chills, appetite change, fatigue and unexpected weight change.  HENT: Negative for ear pain, congestion, sore throat, trouble swallowing, neck pain, voice change and sinus pressure.   Eyes: Negative for visual disturbance.  Respiratory: Negative for cough, shortness of breath, wheezing and stridor.   Cardiovascular: Negative for chest pain, palpitations and leg swelling.  Gastrointestinal: Negative for nausea, vomiting, abdominal pain, diarrhea, constipation, blood in stool, abdominal distention and anal bleeding.  Genitourinary: Negative for dysuria and flank pain.  Musculoskeletal: Negative for myalgias, arthralgias and gait problem.  Skin: Negative for color change and rash.  Neurological: Negative for dizziness and headaches.  Hematological: Negative for adenopathy. Does not bruise/bleed easily.  Psychiatric/Behavioral: Negative for suicidal ideas, sleep disturbance and dysphoric mood. The patient is not  nervous/anxious.        Objective:   Physical Exam  Constitutional: She is oriented to person, place, and time. She appears well-developed and well-nourished. No distress.  HENT:  Head: Normocephalic and atraumatic.  Right Ear: External ear normal.  Left Ear: External ear normal.  Nose: Nose normal.  Mouth/Throat: Oropharynx is clear and moist. No oropharyngeal exudate.  Eyes: Conjunctivae are normal. Pupils are equal, round, and reactive to light. Right eye exhibits no discharge. Left eye exhibits no discharge. No scleral icterus.  Neck: Normal range of motion. Neck supple. No tracheal deviation present. No thyromegaly present.  Cardiovascular: Normal rate, regular rhythm, normal heart sounds and intact distal pulses.  Exam reveals no gallop and no friction rub.   No murmur heard. Pulmonary/Chest: Effort normal and breath sounds normal. No respiratory distress. She has no wheezes. She has no rales. She exhibits no tenderness.  Musculoskeletal: Normal range of motion. She exhibits no edema and no tenderness.  Lymphadenopathy:    She has no cervical adenopathy.  Neurological: She is alert and oriented to person, place, and time. No cranial nerve deficit. She exhibits normal muscle tone. Coordination normal.  Skin: Skin is warm and dry. No rash noted. She is not diaphoretic. No erythema. No pallor.  Psychiatric: She has a normal mood and affect. Her behavior is normal. Judgment and thought content normal.          Assessment & Plan:

## 2012-07-25 NOTE — Assessment & Plan Note (Signed)
Secondary to steatohepatitis. Rechecked today.

## 2012-07-25 NOTE — Assessment & Plan Note (Signed)
Will check lipids including triglycerides with labs today. Discussed a low glycemic index diet. Discussed regular exercise with a goal of 40 minutes 3 times per week. Followup in one month.

## 2012-08-25 ENCOUNTER — Ambulatory Visit (INDEPENDENT_AMBULATORY_CARE_PROVIDER_SITE_OTHER): Payer: 59 | Admitting: *Deleted

## 2012-08-25 DIAGNOSIS — Z23 Encounter for immunization: Secondary | ICD-10-CM

## 2012-08-31 ENCOUNTER — Ambulatory Visit (INDEPENDENT_AMBULATORY_CARE_PROVIDER_SITE_OTHER): Payer: 59 | Admitting: Adult Health

## 2012-08-31 ENCOUNTER — Encounter: Payer: Self-pay | Admitting: Adult Health

## 2012-08-31 VITALS — BP 118/82 | HR 89 | Temp 98.0°F | Resp 14 | Wt 232.0 lb

## 2012-08-31 DIAGNOSIS — IMO0001 Reserved for inherently not codable concepts without codable children: Secondary | ICD-10-CM

## 2012-08-31 DIAGNOSIS — B3731 Acute candidiasis of vulva and vagina: Secondary | ICD-10-CM | POA: Insufficient documentation

## 2012-08-31 DIAGNOSIS — B373 Candidiasis of vulva and vagina: Secondary | ICD-10-CM

## 2012-08-31 DIAGNOSIS — R350 Frequency of micturition: Secondary | ICD-10-CM

## 2012-08-31 LAB — POCT URINALYSIS DIPSTICK
Bilirubin, UA: NEGATIVE
Ketones, UA: NEGATIVE
Protein, UA: NEGATIVE
Spec Grav, UA: 1.02
pH, UA: 6

## 2012-08-31 MED ORDER — CIPROFLOXACIN HCL 250 MG PO TABS: 250.0000 mg | ORAL_TABLET | Freq: Two times a day (BID) | ORAL | Status: DC

## 2012-08-31 MED ORDER — FLUCONAZOLE 150 MG PO TABS: 150.0000 mg | ORAL_TABLET | Freq: Once | ORAL | Status: DC

## 2012-08-31 NOTE — Assessment & Plan Note (Signed)
Suspect UTI. UA positive for nitrites and small amount of leukocytes. Urine sent for culture. Start Cipro x3 days.

## 2012-08-31 NOTE — Patient Instructions (Addendum)
  Start your antibiotic today for your UTI. You will take this twice daily for 3 days.  You can also buy AZO or Urostat (pyridium) to alleviate your urinary symptoms while the antibiotic is treating the infection.  Once the results of the urine culture comes in I will notify you.  Drink plenty of fluids.

## 2012-08-31 NOTE — Assessment & Plan Note (Signed)
Recently treated with antibiotics. Now on Cipro for UTI. Provided Diflucan to take after completing course of antibiotics if itching persists.

## 2012-08-31 NOTE — Progress Notes (Signed)
  Subjective:    Patient ID: Rhonda Kirby, female    DOB: 05/21/1970, 42 y.o.   MRN: [id number]  HPI  Patient is a pleasant 42 year old female who presents to clinic with complaints of frequency. Symptoms began 2 days ago. She is also complaining of mild itching around the vulva without any discharge. She denies any fever or chills.  Current Outpatient Prescriptions on File Prior to Visit  Medication Sig Dispense Refill  . ALPRAZolam (XANAX) 0.25 MG tablet Take 0.25 mg by mouth at bedtime as needed for sleep.      Marland Kitchen ibuprofen (ADVIL,MOTRIN) 200 MG tablet Take 200 mg by mouth every 6 (six) hours as needed for pain.      . milk thistle 175 MG tablet Take 175 mg by mouth daily.       No current facility-administered medications on file prior to visit.     Review of Systems  Constitutional: Negative for fever and chills.  Genitourinary: Positive for frequency. Negative for dysuria, urgency, hematuria, vaginal discharge and difficulty urinating.       Mild itching       Objective:   Physical Exam  Constitutional: She is oriented to person, place, and time. She appears well-developed and well-nourished. No distress.  Cardiovascular: Normal rate and regular rhythm.   Pulmonary/Chest: Effort normal and breath sounds normal.  Neurological: She is alert and oriented to person, place, and time.  Psychiatric: She has a normal mood and affect. Her behavior is normal. Judgment and thought content normal.          Assessment & Plan:

## 2012-09-01 ENCOUNTER — Ambulatory Visit
Admission: RE | Admit: 2012-09-01 | Discharge: 2012-09-01 | Disposition: A | Discharge status: Home / Self Care | Payer: 59 | Source: Ambulatory Visit | Attending: Internal Medicine | Admitting: Internal Medicine

## 2012-09-01 DIAGNOSIS — Z1239 Encounter for other screening for malignant neoplasm of breast: Secondary | ICD-10-CM

## 2012-09-03 LAB — URINE CULTURE

## 2012-09-08 LAB — HM MAMMOGRAPHY

## 2012-10-17 ENCOUNTER — Ambulatory Visit: Payer: 59 | Admitting: Adult Health

## 2012-10-27 ENCOUNTER — Ambulatory Visit (INDEPENDENT_AMBULATORY_CARE_PROVIDER_SITE_OTHER): Payer: 59 | Admitting: Internal Medicine

## 2012-10-27 ENCOUNTER — Encounter: Payer: Self-pay | Admitting: Internal Medicine

## 2012-10-27 VITALS — BP 150/96 | HR 90 | Temp 98.5°F | Wt 231.0 lb

## 2012-10-27 DIAGNOSIS — E781 Pure hyperglyceridemia: Secondary | ICD-10-CM

## 2012-10-27 DIAGNOSIS — K7581 Nonalcoholic steatohepatitis (NASH): Secondary | ICD-10-CM

## 2012-10-27 DIAGNOSIS — E669 Obesity, unspecified: Secondary | ICD-10-CM

## 2012-10-27 DIAGNOSIS — I10 Essential (primary) hypertension: Secondary | ICD-10-CM

## 2012-10-27 DIAGNOSIS — K7689 Other specified diseases of liver: Secondary | ICD-10-CM

## 2012-10-27 LAB — COMPREHENSIVE METABOLIC PANEL
ALT: 40 U/L — ABNORMAL HIGH (ref 0–35)
AST: 27 U/L (ref 0–37)
Alkaline Phosphatase: 49 U/L (ref 39–117)
BUN: 14 mg/dL (ref 6–23)
Calcium: 9.2 mg/dL (ref 8.4–10.5)
Chloride: 103 mEq/L (ref 96–112)
Creatinine, Ser: 0.7 mg/dL (ref 0.4–1.2)

## 2012-10-27 LAB — LDL CHOLESTEROL, DIRECT: Direct LDL: 102.2 mg/dL

## 2012-10-27 LAB — LIPID PANEL
HDL: 30.6 mg/dL — ABNORMAL LOW (ref >39.00)
Triglycerides: 264 mg/dL — ABNORMAL HIGH (ref 0.0–149.0)

## 2012-10-27 NOTE — Assessment & Plan Note (Signed)
Wt Readings from Last 3 Encounters:  10/27/12 231 lb (104.781 kg)  08/31/12 232 lb (105.235 kg)  07/25/12 230 lb (104.327 kg)   Encouraged healthy, Mediterranean style diet and regular physical activity with goal of weight loss.

## 2012-10-27 NOTE — Progress Notes (Signed)
  Subjective:    Patient ID: Rhonda Kirby, female    DOB: October 07, 1970, 42 y.o.   MRN: [id number]  HPI 42YO female with h/o obesity, elevated BP, hypertriglyceridemia presents for follow up. Doing well. Not following any specific diet or exercise program. Has not recently check BP at home. Generally feeling well. No new concerns today.  Outpatient Encounter Prescriptions as of 10/27/2012  Medication Sig Dispense Refill  . ALPRAZolam (XANAX) 0.25 MG tablet Take 0.25 mg by mouth at bedtime as needed for sleep.      Marland Kitchen ibuprofen (ADVIL,MOTRIN) 200 MG tablet Take 200 mg by mouth every 6 (six) hours as needed for pain.       No facility-administered encounter medications on file as of 10/27/2012.   BP 150/96  Pulse 90  Temp(Src) 98.5 F (36.9 C) (Oral)  Wt 231 lb (104.781 kg)  BMI 36.17 kg/m2  SpO2 98%  Review of Systems  Constitutional: Negative for fever, chills, appetite change, fatigue and unexpected weight change.  HENT: Negative for ear pain, congestion, sore throat, trouble swallowing, neck pain, voice change and sinus pressure.   Eyes: Negative for visual disturbance.  Respiratory: Negative for cough, shortness of breath, wheezing and stridor.   Cardiovascular: Negative for chest pain, palpitations and leg swelling.  Gastrointestinal: Negative for nausea, vomiting, abdominal pain, diarrhea, constipation, blood in stool, abdominal distention and anal bleeding.  Genitourinary: Negative for dysuria and flank pain.  Musculoskeletal: Negative for myalgias, arthralgias and gait problem.  Skin: Negative for color change and rash.  Neurological: Negative for dizziness and headaches.  Hematological: Negative for adenopathy. Does not bruise/bleed easily.  Psychiatric/Behavioral: Negative for suicidal ideas, sleep disturbance and dysphoric mood. The patient is not nervous/anxious.        Objective:   Physical Exam  Constitutional: She is oriented to person, place, and time. She appears  well-developed and well-nourished. No distress.  HENT:  Head: Normocephalic and atraumatic.  Right Ear: External ear normal.  Left Ear: External ear normal.  Nose: Nose normal.  Mouth/Throat: Oropharynx is clear and moist. No oropharyngeal exudate.  Eyes: Conjunctivae are normal. Pupils are equal, round, and reactive to light. Right eye exhibits no discharge. Left eye exhibits no discharge. No scleral icterus.  Neck: Normal range of motion. Neck supple. No tracheal deviation present. No thyromegaly present.  Cardiovascular: Normal rate, regular rhythm, normal heart sounds and intact distal pulses.  Exam reveals no gallop and no friction rub.   No murmur heard. Pulmonary/Chest: Effort normal and breath sounds normal. No accessory muscle usage. Not tachypneic. No respiratory distress. She has no decreased breath sounds. She has no wheezes. She has no rhonchi. She has no rales. She exhibits no tenderness.  Musculoskeletal: Normal range of motion. She exhibits no edema and no tenderness.  Lymphadenopathy:    She has no cervical adenopathy.  Neurological: She is alert and oriented to person, place, and time. No cranial nerve deficit. She exhibits normal muscle tone. Coordination normal.  Skin: Skin is warm and dry. No rash noted. She is not diaphoretic. No erythema. No pallor.  Psychiatric: She has a normal mood and affect. Her behavior is normal. Judgment and thought content normal.          Assessment & Plan:

## 2012-10-27 NOTE — Assessment & Plan Note (Signed)
Will check liver function with labs today. 

## 2012-10-27 NOTE — Assessment & Plan Note (Signed)
Will check lipids and LFTs with labs today. Encouraged healthy, Mediterranean Style diet.

## 2012-10-27 NOTE — Patient Instructions (Signed)
Check blood pressure at home 1-2 times per week and email readings.

## 2012-10-27 NOTE — Assessment & Plan Note (Addendum)
BP Readings from Last 3 Encounters:  10/27/12 150/96  08/31/12 118/82  07/25/12 148/100   BP elevated in clinic, however pt reports better controlled at home. Will have pt monitor BP at home and email with BP readings once per week. If persistently >140/90, discussed starting medication. Gave information on the DASH diet. Follow up 3 months and prn.

## 2012-12-31 ENCOUNTER — Encounter: Payer: Self-pay | Admitting: Internal Medicine

## 2013-01-05 ENCOUNTER — Ambulatory Visit (INDEPENDENT_AMBULATORY_CARE_PROVIDER_SITE_OTHER): Payer: 59 | Admitting: Internal Medicine

## 2013-01-05 ENCOUNTER — Encounter: Payer: Self-pay | Admitting: Emergency Medicine

## 2013-01-05 ENCOUNTER — Encounter: Payer: Self-pay | Admitting: Internal Medicine

## 2013-01-05 ENCOUNTER — Ambulatory Visit: Payer: Self-pay | Admitting: Internal Medicine

## 2013-01-05 VITALS — BP 130/90 | HR 84 | Temp 98.7°F | Ht 67.5 in | Wt 229.0 lb

## 2013-01-05 DIAGNOSIS — I10 Essential (primary) hypertension: Secondary | ICD-10-CM

## 2013-01-05 DIAGNOSIS — R1011 Right upper quadrant pain: Secondary | ICD-10-CM

## 2013-01-05 LAB — COMPREHENSIVE METABOLIC PANEL
ALT: 42 U/L — ABNORMAL HIGH (ref 0–35)
CO2: 27 mEq/L (ref 19–32)
Calcium: 9.2 mg/dL (ref 8.4–10.5)
Chloride: 106 mEq/L (ref 96–112)
GFR: 98.9 mL/min (ref >60.00)
Glucose, Bld: 94 mg/dL (ref 70–99)
Sodium: 137 mEq/L (ref 135–145)
Total Bilirubin: 0.6 mg/dL (ref 0.3–1.2)
Total Protein: 7.5 g/dL (ref 6.0–8.3)

## 2013-01-05 MED ORDER — HYDROCHLOROTHIAZIDE 12.5 MG PO CAPS: 12.5000 mg | ORAL_CAPSULE | Freq: Every day | ORAL | Status: DC

## 2013-01-05 NOTE — Assessment & Plan Note (Signed)
BP Readings from Last 3 Encounters:  01/05/13 130/90  10/27/12 150/96  08/31/12 118/82   Blood pressure has consistently been elevated. Encourage continued efforts at healthy diet and weight loss. Encouraged her to increase physical activity with goal of walking 30 minutes per day. Will start hydrochlorothiazide 12.5 mg daily. Followup in 4 weeks or sooner as needed.

## 2013-01-05 NOTE — Assessment & Plan Note (Signed)
Abdominal pain right upper quadrant with tenderness to palpation on exam most consistent with cholecystitis. However, RUQ [place] today on prelim report is normal with no cholelithiasis or inflammation observed. Will plan to set up HIDA scan for further evaluation. CMP today showed stable elevation of LFTs.

## 2013-01-05 NOTE — Progress Notes (Signed)
Subjective:    Patient ID: Rhonda Kirby, female    DOB: 05-19-1970, 42 y.o.   MRN: [id number]  HPI 42 year old female with history of steatohepatitis presents for acute visit complaining of several weeks of intermittent right upper quadrant abdominal pain. The pain is described as cramping or aching. It is located in the right upper quadrant and does not radiate. It does not seem to be associated with any particular activity or food intake. It is not generally associated with nausea. She denies any change in bowel habits or appetite. She is not taking any new medications. She has occasionally taken ibuprofen with minimal improvement in the pain.  She is also concerned about elevated blood pressures. She has been checking her blood pressure several times per week at most readings have been near 140/90. She has a couple of readings which are greater than 160/90. She is not currently taking any medication for blood pressure. She has had some occasional headache. She denies chest pain or palpitations. She notes increased stress at work recently. She tries to eat healthier but does not exercise.  Outpatient Encounter Prescriptions as of 01/05/2013  Medication Sig Dispense Refill  . ALPRAZolam (XANAX) 0.25 MG tablet Take 0.25 mg by mouth at bedtime as needed for sleep.      Marland Kitchen ibuprofen (ADVIL,MOTRIN) 200 MG tablet Take 200 mg by mouth every 6 (six) hours as needed for pain.       No facility-administered encounter medications on file as of 01/05/2013.   BP 130/90  Pulse 84  Temp(Src) 98.7 F (37.1 C) (Oral)  Ht 5' 7.5" (1.715 m)  Wt 229 lb (103.874 kg)  BMI 35.32 kg/m2  SpO2 98%  Review of Systems  Constitutional: Negative for fever, chills, appetite change, fatigue and unexpected weight change.  HENT: Negative for ear pain, congestion, sore throat, trouble swallowing, neck pain, voice change and sinus pressure.   Eyes: Negative for visual disturbance.  Respiratory: Negative for cough, shortness  of breath, wheezing and stridor.   Cardiovascular: Negative for chest pain, palpitations and leg swelling.  Gastrointestinal: Positive for abdominal pain. Negative for nausea, vomiting, diarrhea, constipation, blood in stool, abdominal distention and anal bleeding.  Genitourinary: Negative for dysuria and flank pain.  Musculoskeletal: Negative for myalgias, arthralgias and gait problem.  Skin: Negative for color change and rash.  Neurological: Negative for dizziness and headaches.  Hematological: Negative for adenopathy. Does not bruise/bleed easily.  Psychiatric/Behavioral: Negative for suicidal ideas, sleep disturbance and dysphoric mood. The patient is not nervous/anxious.        Objective:   Physical Exam  Constitutional: She is oriented to person, place, and time. She appears well-developed and well-nourished. No distress.  HENT:  Head: Normocephalic and atraumatic.  Right Ear: External ear normal.  Left Ear: External ear normal.  Nose: Nose normal.  Mouth/Throat: Oropharynx is clear and moist. No oropharyngeal exudate.  Eyes: Conjunctivae are normal. Pupils are equal, round, and reactive to light. Right eye exhibits no discharge. Left eye exhibits no discharge. No scleral icterus.  Neck: Normal range of motion. Neck supple. No tracheal deviation present. No thyromegaly present.  Cardiovascular: Normal rate, regular rhythm, normal heart sounds and intact distal pulses.  Exam reveals no gallop and no friction rub.   No murmur heard. Pulmonary/Chest: Effort normal and breath sounds normal. No accessory muscle usage. Not tachypneic. No respiratory distress. She has no decreased breath sounds. She has no wheezes. She has no rhonchi. She has no rales. She exhibits no tenderness.  Abdominal: Soft. Bowel sounds are normal. She exhibits no distension and no mass. There is tenderness (ruq). There is no rebound and no guarding.  Musculoskeletal: Normal range of motion. She exhibits no edema and  no tenderness.  Lymphadenopathy:    She has no cervical adenopathy.  Neurological: She is alert and oriented to person, place, and time. No cranial nerve deficit. She exhibits normal muscle tone. Coordination normal.  Skin: Skin is warm and dry. No rash noted. She is not diaphoretic. No erythema. No pallor.  Psychiatric: She has a normal mood and affect. Her behavior is normal. Judgment and thought content normal.          Assessment & Plan:

## 2013-01-06 ENCOUNTER — Telehealth: Payer: Self-pay | Admitting: Internal Medicine

## 2013-01-06 NOTE — Telephone Encounter (Signed)
Ultrasound of the abdomen showed no abnormalities of the gallbladder, however there is persistent fatty infiltration in the liver. I do not think this is causing your abdominal pain. I would like to proceed with a HIDA scan as we discussed. I have ordered this to be scheduled at your convenience

## 2013-01-11 ENCOUNTER — Ambulatory Visit: Payer: Self-pay | Admitting: Internal Medicine

## 2013-01-12 ENCOUNTER — Encounter: Payer: Self-pay | Admitting: *Deleted

## 2013-01-12 ENCOUNTER — Encounter: Payer: Self-pay | Admitting: Internal Medicine

## 2013-01-12 ENCOUNTER — Telehealth: Payer: Self-pay | Admitting: Internal Medicine

## 2013-01-12 DIAGNOSIS — K829 Disease of gallbladder, unspecified: Secondary | ICD-10-CM

## 2013-01-12 NOTE — Telephone Encounter (Signed)
Patient informed and verbally informed, has already spoken with Amber about this.

## 2013-01-12 NOTE — Telephone Encounter (Signed)
HIDA scan showed abnormal gallbladder function. I would recommend referral to general surgery for gallbladder removal.

## 2013-01-17 ENCOUNTER — Ambulatory Visit: Payer: Self-pay | Admitting: General Surgery

## 2013-01-19 ENCOUNTER — Encounter: Payer: Self-pay | Admitting: Internal Medicine

## 2013-01-20 ENCOUNTER — Encounter: Payer: Self-pay | Admitting: Internal Medicine

## 2013-01-23 ENCOUNTER — Encounter: Payer: Self-pay | Admitting: Internal Medicine

## 2013-01-25 ENCOUNTER — Encounter: Payer: Self-pay | Admitting: General Surgery

## 2013-01-25 ENCOUNTER — Ambulatory Visit (INDEPENDENT_AMBULATORY_CARE_PROVIDER_SITE_OTHER): Payer: 59 | Admitting: General Surgery

## 2013-01-25 VITALS — BP 130/82 | HR 82 | Resp 12 | Ht 67.5 in | Wt 227.0 lb

## 2013-01-25 DIAGNOSIS — R109 Unspecified abdominal pain: Secondary | ICD-10-CM

## 2013-01-25 NOTE — Progress Notes (Addendum)
Patient ID: Rhonda Kirby, female   DOB: 11/28/1970, 42 y.o.   MRN: [id number]  Chief Complaint  Patient presents with  . Other    gall bladder evaluation    HPI Rhonda Kirby is a 42 y.o. female.  Patient here today for evaluation of gall bladder referred by Dr. Tyler Aas. States she has had two "gall bladder attacks" lasting about 1 day associated with right abdominal pain, bloated and nausea. Denies vomiting. She has been monitoring her diet and has not really associated the pain with an particular foods. First noticed August 6 and Sept 8. Ultrasound was done 01-05-13 and HIDA scan 01-11-13. During the HIDA scan she did become a little "uncomfortable". Denies any change in bowel habits.  HPI  Past Medical History  Diagnosis Date  . Herpes genitalis   . Hypertension   . Fatty liver     Past Surgical History  Procedure Laterality Date  . Tonsillectomy  1976  . Tubal ligation  2008  . Cesarean section  2003, 2008    Family History  Problem Relation Age of Onset  . Hypertension Mother     Social History History  Substance Use Topics  . Smoking status: Former Smoker -- 1.00 packs/day for 10 years    Quit date: 05/04/2000  . Smokeless tobacco: Never Used  . Alcohol Use: Yes    No Known Allergies  Current Outpatient Prescriptions  Medication Sig Dispense Refill  . ALPRAZolam (XANAX) 0.25 MG tablet Take 0.25 mg by mouth at bedtime as needed for sleep.      . hydrochlorothiazide (MICROZIDE) 12.5 MG capsule Take 1 capsule (12.5 mg total) by mouth daily.  30 capsule  6  . ibuprofen (ADVIL,MOTRIN) 200 MG tablet Take 200 mg by mouth every 6 (six) hours as needed for pain.       No current facility-administered medications for this visit.    Review of Systems Review of Systems  Constitutional: Negative.   Respiratory: Negative.   Cardiovascular: Negative.   Gastrointestinal: Positive for nausea and abdominal pain (and bloating). Negative for vomiting, diarrhea and  constipation.    Blood pressure 130/82, pulse 82, resp. rate 12, height 5' 7.5" (1.715 m), weight 227 lb (102.967 kg), last menstrual period 01/14/2013.  Physical Exam Physical Exam  Constitutional: She is oriented to person, place, and time. She appears well-developed and well-nourished.  Eyes: Conjunctivae are normal. No scleral icterus.  Neck: Neck supple.  Cardiovascular: Normal rate and regular rhythm.   Pulmonary/Chest: Effort normal and breath sounds normal.  Abdominal: Soft. Normal appearance. There is no tenderness.  Lymphadenopathy:    She has no cervical adenopathy.  Neurological: She is alert and oriented to person, place, and time.  Skin: Skin is warm and dry.    Data Reviewed  PROCEDURE: KUS - KUS ABDOMEN LTD 1 ORGAN OR QUAD  - Jan 05 2013 11:21AM   RESULT: Right upper quadrant abdominal ultrasound demonstrates limited  visualization of the pancreas without gross abnormality. The liver is  extremely echodense consistent with hepatic steatosis. The liver length  is abnormally enlarged to 19.19 cm. The gallbladder is fluid-filled  without stones or sludge. The portal venous flow is normal. The common  bile duct diameter 3.1 mm. Gallbladder wall thickness is 2.0 mm. There is  a negative sonographic Murphy's sign.  IMPRESSION:  1. Limited visualization of the pancreas. 2. Hepatomegaly with hepatic steatosis likely present.   PROCEDURE: KNM - KNM HEPATO W/GB EJECT FRACTION  -  Jan 11 2013 10:18AM   RESULT: The patient received 8.2 mCi of technetium 22m labeled Choletec  intravenously for this study. The patient also ingested 8 ounces of  Ensure for the dynamic phase of the study.  There is adequate uptake of the radiopharmaceutical by the liver.  Activity is visible ithin the intrahepatic ducts and common bile duct and  gallbladder by 15 minutes. Bowel activity is evident by 30 minutes. The  30 minute gallbladder ejection fraction however is only 31% which is  low.  IMPRESSION:  1. There is no evidence of cystic duct obstruction or common bile duct  obstruction. 2. I cannot exclude gallbladder dysfunction given the low gallbladder  ejection fraction of 31%.    Comprehensive metabolic panel completed 3 weeks ago was notable only for mild elevation of the SGPT at 42. This is improved from 7 months ago in stable from 3 months ago. Bilirubin and alkaline phosphatase are normal. Electrolytes are normal. CBC completed in February 2014 is unremarkable, hemoglobin 15, white blood cell count 6300, platelet count 274,000.   Assessment    Abdominal pain, possibly related to chronic cholecystitis/biliary skin he appears    Plan    The patient's pain in the right upper quadrant with radiation to the back is strongly suggestive of a biliary tract source. Ultrasound is negative for stones. The HIDA scan shows a absolute lower limit of normal ejection fraction of 31%, with perhaps mild reproduction of symptoms during the ingestion of Ensure for biliary stimulation. I suspect the patient has a 6/10 chance of improvement in her symptoms of elective cholecystectomy. With a total absence of any GI symptoms (diarrhea, mucus, blood (think the likelihood of a colonic source is exceptionally well and with no family history of colon cancer or polyps I would not recommend a colonoscopy prior to cholecystectomy. The patient is not made use of a long course of a PPI, and while she has no heartburn symptoms, there is certainly a possibility of duodenitis as a source for some of her discomfort. A 2 week trial of twice a day OTC Prilosec/Prevacid were to relieve her symptoms would point more to a GI rather than biliary tract source.  The patient will consider her options for management. The risks of the procedure were reviewed in detail. The possibility of an open procedure was discussed.  She'll contact the office with her decision regarding surgical intervention    Patient to  call back and arrange if she decides to proceed with surgery.   [name] 01/26/2013, 5:28 PM

## 2013-01-25 NOTE — Patient Instructions (Addendum)
The patient is aware to call back for any questions or concerns. Encouraged weight loss regimen.  Consider Prilosec (twice a day) therapy for 2 weeks. Consider surgery and call back if she desires.   Laparoscopic Cholecystectomy Laparoscopic cholecystectomy is surgery to remove the gallbladder. The gallbladder is located slightly to the right of center in the abdomen, behind the liver. It is a concentrating and storage sac for the bile produced in the liver. Bile aids in the digestion and absorption of fats. Gallbladder disease (cholecystitis) is an inflammation of your gallbladder. This condition is usually caused by a buildup of gallstones (cholelithiasis) in your gallbladder. Gallstones can block the flow of bile, resulting in inflammation and pain. In severe cases, emergency surgery may be required. When emergency surgery is not required, you will have time to prepare for the procedure. Laparoscopic surgery is an alternative to open surgery. Laparoscopic surgery usually has a shorter recovery time. Your common bile duct may also need to be examined and explored. Your caregiver will discuss this with you if he or she feels this should be done. If stones are found in the common bile duct, they may be removed. LET YOUR CAREGIVER KNOW ABOUT:  Allergies to food or medicine.  Medicines taken, including vitamins, herbs, eyedrops, over-the-counter medicines, and creams.  Use of steroids (by mouth or creams).  Previous problems with anesthetics or numbing medicines.  History of bleeding problems or blood clots.  Previous surgery.  Other health problems, including diabetes and kidney problems.  Possibility of pregnancy, if this applies. RISKS AND COMPLICATIONS All surgery is associated with risks. Some problems that may occur following this procedure include:  Infection.  Damage to the common bile duct, nerves, arteries, veins, or other internal organs such as the stomach or  intestines.  Bleeding.  A stone may remain in the common bile duct. BEFORE THE PROCEDURE  Do not take aspirin for 3 days prior to surgery or blood thinners for 1 week prior to surgery.  Do not eat or drink anything after midnight the night before surgery.  Let your caregiver know if you develop a cold or other infectious problem prior to surgery.  You should be present 60 minutes before the procedure or as directed. PROCEDURE  You will be given medicine that makes you sleep (general anesthetic). When you are asleep, your surgeon will make several small cuts (incisions) in your abdomen. One of these incisions is used to insert a small, lighted scope (laparoscope) into the abdomen. The laparoscope helps the surgeon see into your abdomen. Carbon dioxide gas will be pumped into your abdomen. The gas allows more room for the surgeon to perform your surgery. Other operating instruments are inserted through the other incisions. Laparoscopic procedures may not be appropriate when:  There is major scarring from previous surgery.  The gallbladder is extremely inflamed.  There are bleeding disorders or unexpected cirrhosis of the liver.  A pregnancy is near term.  Other conditions make the laparoscopic procedure impossible. If your surgeon feels it is not safe to continue with a laparoscopic procedure, he or she will perform an open abdominal procedure. In this case, the surgeon will make an incision to open the abdomen. This gives the surgeon a larger view and field to work within. This may allow the surgeon to perform procedures that sometimes cannot be performed with a laparoscope alone. Open surgery has a longer recovery time. AFTER THE PROCEDURE  You will be taken to the recovery area where a nurse  will watch and check your progress.  You may be allowed to go home the same day.  Do not resume physical activities until directed by your caregiver.  You may resume a normal diet and  activities as directed. Document Released: 04/20/2005 Document Revised: 07/13/2011 Document Reviewed: 10/03/2010 [facility] Patient Information 2014 [place], [place].  Patient to call back and arrange if she decides to proceed with surgery.

## 2013-01-26 ENCOUNTER — Telehealth: Payer: Self-pay | Admitting: *Deleted

## 2013-01-26 ENCOUNTER — Encounter: Payer: Self-pay | Admitting: General Surgery

## 2013-01-26 ENCOUNTER — Other Ambulatory Visit: Payer: Self-pay | Admitting: General Surgery

## 2013-01-26 DIAGNOSIS — K828 Other specified diseases of gallbladder: Secondary | ICD-10-CM

## 2013-01-26 DIAGNOSIS — R109 Unspecified abdominal pain: Secondary | ICD-10-CM

## 2013-01-26 NOTE — Telephone Encounter (Signed)
Patient called back today and decided to proceed with gallbladder surgery. This patient's surgery has been scheduled for 02-07-13 at [facility]. She will call the office if she has further questions.

## 2013-02-01 ENCOUNTER — Ambulatory Visit: Payer: Self-pay | Admitting: Anesthesiology

## 2013-02-02 ENCOUNTER — Ambulatory Visit (INDEPENDENT_AMBULATORY_CARE_PROVIDER_SITE_OTHER): Payer: 59 | Admitting: Internal Medicine

## 2013-02-02 ENCOUNTER — Encounter: Payer: Self-pay | Admitting: Internal Medicine

## 2013-02-02 VITALS — BP 128/82 | HR 88 | Temp 98.5°F | Wt 228.0 lb

## 2013-02-02 DIAGNOSIS — H6980 Other specified disorders of Eustachian tube, unspecified ear: Secondary | ICD-10-CM

## 2013-02-02 DIAGNOSIS — I10 Essential (primary) hypertension: Secondary | ICD-10-CM

## 2013-02-02 DIAGNOSIS — H6983 Other specified disorders of Eustachian tube, bilateral: Secondary | ICD-10-CM

## 2013-02-02 DIAGNOSIS — R1011 Right upper quadrant pain: Secondary | ICD-10-CM

## 2013-02-02 DIAGNOSIS — H6990 Unspecified Eustachian tube disorder, unspecified ear: Secondary | ICD-10-CM | POA: Insufficient documentation

## 2013-02-02 NOTE — Assessment & Plan Note (Signed)
Bilateral middle ear effusions likely contributing to tinnitus. Will set up ENT evaluation. Question if she may need tympanostomy tubes.

## 2013-02-02 NOTE — Assessment & Plan Note (Signed)
[place] normal. HIDA showed delayed gastric emptying. Cholecystectomy planned for next week.

## 2013-02-02 NOTE — Assessment & Plan Note (Signed)
BP Readings from Last 3 Encounters:  02/02/13 128/82  01/25/13 130/82  01/05/13 130/90   BP improved with use of HCTZ. Will continue. Follow up 3 months and prn.

## 2013-02-02 NOTE — Progress Notes (Signed)
Subjective:    Patient ID: Rhonda Kirby, female    DOB: August 07, 1970, 42 y.o.   MRN: [id number]  HPI 42YO female with h/o HTN, obesity presents for follow up. At last visit, she was concerned about intermittent RUQ pain. [place] was normal. HIDA showed delayed emptying of gallbladder. She was evaluated by general surgery and cholecystectomy planned for next week. Notes continue mild RUQ pain, no NV.  Also recently started on HCTZ to better control BP. No side effects noted. BP better controlled at home per pt. No headache, chest pain.  Concerned about persistent buzzing sounds in her ears. Ongoing for several months. 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 No current ear pain. No congestion, fever, chills.  Outpatient Encounter Prescriptions as of 02/02/2013  Medication Sig Dispense Refill  . ALPRAZolam (XANAX) 0.25 MG tablet Take 0.25 mg by mouth at bedtime as needed for sleep.      . cetirizine (ZYRTEC) 10 MG tablet Take 10 mg by mouth daily.      . hydrochlorothiazide (MICROZIDE) 12.5 MG capsule Take 1 capsule (12.5 mg total) by mouth daily.  30 capsule  6  . ibuprofen (ADVIL,MOTRIN) 200 MG tablet Take 200 mg by mouth every 6 (six) hours as needed for pain.       No facility-administered encounter medications on file as of 02/02/2013.   BP 128/82  Pulse 88  Temp(Src) 98.5 F (36.9 C) (Oral)  Wt 228 lb (103.42 kg)  BMI 35.16 kg/m2  SpO2 98%  LMP 01/14/2013  Review of Systems  Constitutional: Negative for fever, chills, appetite change, fatigue and unexpected weight change.  HENT: Positive for tinnitus. Negative for ear pain, congestion, sore throat, trouble swallowing, neck pain, voice change and sinus pressure.   Eyes: Negative for visual disturbance.  Respiratory: Negative for cough, shortness of breath, wheezing and stridor.   Cardiovascular: Negative for chest pain, palpitations and leg swelling.  Gastrointestinal: Negative for nausea, vomiting, abdominal pain, diarrhea,  constipation, blood in stool, abdominal distention and anal bleeding.  Genitourinary: Negative for dysuria and flank pain.  Musculoskeletal: Negative for myalgias, arthralgias and gait problem.  Skin: Negative for color change and rash.  Neurological: Negative for dizziness and headaches.  Hematological: Negative for adenopathy. Does not bruise/bleed easily.  Psychiatric/Behavioral: Negative for suicidal ideas, sleep disturbance and dysphoric mood. The patient is not nervous/anxious.        Objective:   Physical Exam  Constitutional: She is oriented to person, place, and time. She appears well-developed and well-nourished. No distress.  HENT:  Head: Normocephalic and atraumatic.  Right Ear: External ear normal. Tympanic membrane is scarred. A middle ear effusion is present.  Left Ear: External ear normal. Tympanic membrane is scarred. A middle ear effusion is present.  Nose: Nose normal.  Mouth/Throat: Oropharynx is clear and moist. No oropharyngeal exudate.  Eyes: Conjunctivae are normal. Pupils are equal, round, and reactive to light. Right eye exhibits no discharge. Left eye exhibits no discharge. No scleral icterus.  Neck: Normal range of motion. Neck supple. No tracheal deviation present. No thyromegaly present.  Cardiovascular: Normal rate, regular rhythm, normal heart sounds and intact distal pulses.  Exam reveals no gallop and no friction rub.   No murmur heard. Pulmonary/Chest: Effort normal and breath sounds normal. No accessory muscle usage. Not tachypneic. No respiratory distress. She has no decreased breath sounds. She has no wheezes. She has no rhonchi. She has no rales. She exhibits no tenderness.  Musculoskeletal: Normal range of motion.  She exhibits no edema and no tenderness.  Lymphadenopathy:    She has no cervical adenopathy.  Neurological: She is alert and oriented to person, place, and time. No cranial nerve deficit. She exhibits normal muscle tone. Coordination  normal.  Skin: Skin is warm and dry. No rash noted. She is not diaphoretic. No erythema. No pallor.  Psychiatric: She has a normal mood and affect. Her behavior is normal. Judgment and thought content normal.          Assessment & Plan:

## 2013-02-03 ENCOUNTER — Encounter: Payer: Self-pay | Admitting: Internal Medicine

## 2013-02-07 ENCOUNTER — Encounter: Payer: Self-pay | Admitting: General Surgery

## 2013-02-07 ENCOUNTER — Ambulatory Visit: Payer: Self-pay | Admitting: General Surgery

## 2013-02-07 DIAGNOSIS — K811 Chronic cholecystitis: Secondary | ICD-10-CM

## 2013-02-07 HISTORY — PX: CHOLECYSTECTOMY: SHX55

## 2013-02-08 ENCOUNTER — Encounter: Payer: Self-pay | Admitting: General Surgery

## 2013-02-08 LAB — PATHOLOGY REPORT

## 2013-02-13 ENCOUNTER — Encounter: Payer: Self-pay | Admitting: General Surgery

## 2013-02-14 ENCOUNTER — Encounter: Payer: Self-pay | Admitting: General Surgery

## 2013-02-14 ENCOUNTER — Ambulatory Visit (INDEPENDENT_AMBULATORY_CARE_PROVIDER_SITE_OTHER): Payer: 59 | Admitting: General Surgery

## 2013-02-14 VITALS — BP 126/70 | HR 74 | Resp 12 | Ht 67.0 in | Wt 224.0 lb

## 2013-02-14 DIAGNOSIS — R109 Unspecified abdominal pain: Secondary | ICD-10-CM

## 2013-02-14 NOTE — Progress Notes (Signed)
Patient ID: Rhonda Kirby, female   DOB: 06/16/1970, 42 y.o.   MRN: [id number]  Chief Complaint  Patient presents with  . Routine Post Op    gallbladder    HPI Evangaline C Kirby is a 42 y.o. female here today for her post op gallbladder surgery done 02/07/13.Patient states she is doing well.  HPI  Past Medical History  Diagnosis Date  . Herpes genitalis   . Hypertension   . Fatty liver     Past Surgical History  Procedure Laterality Date  . Tonsillectomy  1976  . Tubal ligation  2008  . Cesarean section  2003, 2008  . Cholecystectomy  02/07/13    Family History  Problem Relation Age of Onset  . Hypertension Mother     Social History History  Substance Use Topics  . Smoking status: Former Smoker -- 1.00 packs/day for 10 years    Quit date: 05/04/2000  . Smokeless tobacco: Never Used  . Alcohol Use: Yes    No Known Allergies  Current Outpatient Prescriptions  Medication Sig Dispense Refill  . ALPRAZolam (XANAX) 0.25 MG tablet Take 0.25 mg by mouth at bedtime as needed for sleep.      . cetirizine (ZYRTEC) 10 MG tablet Take 10 mg by mouth daily.      . hydrochlorothiazide (MICROZIDE) 12.5 MG capsule Take 1 capsule (12.5 mg total) by mouth daily.  30 capsule  6  . HYDROcodone-acetaminophen (NORCO/VICODIN) 5-325 MG per tablet Take 1 tablet by mouth daily.      Marland Kitchen ibuprofen (ADVIL,MOTRIN) 200 MG tablet Take 200 mg by mouth every 6 (six) hours as needed for pain.       No current facility-administered medications for this visit.    Review of Systems Review of Systems  Constitutional: Negative.   Respiratory: Negative.   Cardiovascular: Negative.     Blood pressure 126/70, pulse 74, resp. rate 12, height 5\' 7"  (1.702 m), weight 224 lb (101.606 kg), last menstrual period 01/14/2013.  Physical Exam Physical Exam  Constitutional: She is oriented to person, place, and time. She appears well-developed and well-nourished.  Cardiovascular: Normal rate, regular rhythm and  normal heart sounds.   Abdominal: Soft. Normal appearance and bowel sounds are normal.  Port site looks clean .  Lymphadenopathy:    She has no cervical adenopathy.  Neurological: She is alert and oriented to person, place, and time.  Skin: Skin is warm and dry.    Data Reviewed athology showed chronic cholecystitis and cholelithiasis.  Assessment    Good recovery status post cholecystectomy.     Plan    Good judgment with strenuous activity was encouraged. The patient will contact the office if she has any needs.        [name] 02/15/2013, 9:09 PM

## 2013-02-14 NOTE — Patient Instructions (Signed)
Patient to return as needed. 

## 2013-02-28 ENCOUNTER — Ambulatory Visit: Payer: 59

## 2013-02-28 ENCOUNTER — Ambulatory Visit (INDEPENDENT_AMBULATORY_CARE_PROVIDER_SITE_OTHER): Payer: 59 | Admitting: *Deleted

## 2013-02-28 DIAGNOSIS — Z23 Encounter for immunization: Secondary | ICD-10-CM

## 2013-03-08 ENCOUNTER — Encounter: Payer: Self-pay | Admitting: General Surgery

## 2013-03-09 ENCOUNTER — Other Ambulatory Visit: Payer: Self-pay

## 2013-05-04 DIAGNOSIS — D1800 Hemangioma unspecified site: Secondary | ICD-10-CM

## 2013-05-04 HISTORY — DX: Hemangioma unspecified site: D18.00

## 2013-05-15 ENCOUNTER — Encounter: Payer: Self-pay | Admitting: Internal Medicine

## 2013-05-15 DIAGNOSIS — H9319 Tinnitus, unspecified ear: Secondary | ICD-10-CM

## 2013-05-22 ENCOUNTER — Encounter: Payer: Self-pay | Admitting: Internal Medicine

## 2013-05-24 ENCOUNTER — Encounter: Payer: Self-pay | Admitting: Internal Medicine

## 2013-05-25 ENCOUNTER — Ambulatory Visit: Payer: 59 | Admitting: Internal Medicine

## 2013-06-11 ENCOUNTER — Encounter: Payer: Self-pay | Admitting: Internal Medicine

## 2013-06-15 ENCOUNTER — Ambulatory Visit: Payer: 59 | Admitting: Internal Medicine

## 2013-07-30 ENCOUNTER — Other Ambulatory Visit: Payer: Self-pay | Admitting: Internal Medicine

## 2013-08-02 ENCOUNTER — Encounter: Payer: Self-pay | Admitting: Internal Medicine

## 2013-08-02 MED ORDER — ALPRAZOLAM 0.25 MG PO TABS: 0.2500 mg | ORAL_TABLET | Freq: Every evening | ORAL | Status: DC | PRN

## 2013-08-02 NOTE — Telephone Encounter (Signed)
Last visit 02/02/13, refill?

## 2013-09-04 ENCOUNTER — Other Ambulatory Visit: Payer: Self-pay

## 2013-09-04 DIAGNOSIS — Z1231 Encounter for screening mammogram for malignant neoplasm of breast: Secondary | ICD-10-CM

## 2013-10-16 ENCOUNTER — Ambulatory Visit: Payer: 59 | Admitting: Internal Medicine

## 2013-10-24 ENCOUNTER — Ambulatory Visit: Payer: 59

## 2013-11-01 ENCOUNTER — Ambulatory Visit: Payer: 59 | Admitting: Internal Medicine

## 2013-11-01 ENCOUNTER — Ambulatory Visit
Admission: RE | Admit: 2013-11-01 | Discharge: 2013-11-01 | Disposition: A | Discharge status: Home / Self Care | Payer: 59 | Source: Ambulatory Visit

## 2013-11-01 DIAGNOSIS — Z1231 Encounter for screening mammogram for malignant neoplasm of breast: Secondary | ICD-10-CM

## 2013-11-13 ENCOUNTER — Encounter: Payer: Self-pay | Admitting: Internal Medicine

## 2013-11-13 MED ORDER — FLUCONAZOLE 150 MG PO TABS: 150.0000 mg | ORAL_TABLET | Freq: Once | ORAL | Status: DC

## 2013-12-06 ENCOUNTER — Encounter: Payer: Self-pay | Admitting: Internal Medicine

## 2013-12-06 ENCOUNTER — Ambulatory Visit (INDEPENDENT_AMBULATORY_CARE_PROVIDER_SITE_OTHER): Payer: 59 | Admitting: Internal Medicine

## 2013-12-06 VITALS — BP 124/86 | HR 82 | Temp 98.1°F | Ht 67.75 in | Wt 237.0 lb

## 2013-12-06 DIAGNOSIS — E669 Obesity, unspecified: Secondary | ICD-10-CM

## 2013-12-06 DIAGNOSIS — E781 Pure hyperglyceridemia: Secondary | ICD-10-CM

## 2013-12-06 DIAGNOSIS — D1800 Hemangioma unspecified site: Secondary | ICD-10-CM

## 2013-12-06 DIAGNOSIS — K7581 Nonalcoholic steatohepatitis (NASH): Secondary | ICD-10-CM

## 2013-12-06 DIAGNOSIS — I10 Essential (primary) hypertension: Secondary | ICD-10-CM

## 2013-12-06 DIAGNOSIS — K7689 Other specified diseases of liver: Secondary | ICD-10-CM

## 2013-12-06 LAB — CBC WITH DIFFERENTIAL/PLATELET
Basophils Absolute: 0 10*3/uL (ref 0.0–0.1)
Basophils Relative: 0.4 % (ref 0.0–3.0)
EOS PCT: 2.5 % (ref 0.0–5.0)
Eosinophils Absolute: 0.2 10*3/uL (ref 0.0–0.7)
HEMATOCRIT: 44.3 % (ref 36.0–46.0)
HEMOGLOBIN: 15.5 g/dL — AB (ref 12.0–15.0)
LYMPHS ABS: 1.7 10*3/uL (ref 0.7–4.0)
Lymphocytes Relative: 21.8 % (ref 12.0–46.0)
MCHC: 34.9 g/dL (ref 30.0–36.0)
MCV: 86.4 fl (ref 78.0–100.0)
MONOS PCT: 4.6 % (ref 3.0–12.0)
Monocytes Absolute: 0.4 10*3/uL (ref 0.1–1.0)
NEUTROS ABS: 5.6 10*3/uL (ref 1.4–7.7)
Neutrophils Relative %: 70.7 % (ref 43.0–77.0)
Platelets: 215 10*3/uL (ref 150.0–400.0)
RBC: 5.13 Mil/uL — ABNORMAL HIGH (ref 3.87–5.11)
RDW: 12.8 % (ref 11.5–15.5)
WBC: 7.9 10*3/uL (ref 4.0–10.5)

## 2013-12-06 LAB — MICROALBUMIN / CREATININE URINE RATIO
Creatinine,U: 160.2 mg/dL
Microalb Creat Ratio: 2.3 mg/g (ref 0.0–30.0)
Microalb, Ur: 3.7 mg/dL — ABNORMAL HIGH (ref 0.0–1.9)

## 2013-12-06 LAB — HM MAMMOGRAPHY: HM MAMMO: NORMAL

## 2013-12-06 LAB — COMPREHENSIVE METABOLIC PANEL
ALT: 72 U/L — AB (ref 0–35)
AST: 47 U/L — AB (ref 0–37)
Albumin: 4.2 g/dL (ref 3.5–5.2)
Alkaline Phosphatase: 51 U/L (ref 39–117)
BILIRUBIN TOTAL: 0.9 mg/dL (ref 0.2–1.2)
BUN: 14 mg/dL (ref 6–23)
CHLORIDE: 104 meq/L (ref 96–112)
CO2: 24 mEq/L (ref 19–32)
Calcium: 8.9 mg/dL (ref 8.4–10.5)
Creatinine, Ser: 0.7 mg/dL (ref 0.4–1.2)
GFR: 93.75 mL/min (ref >60.00)
Glucose, Bld: 106 mg/dL — ABNORMAL HIGH (ref 70–99)
Potassium: 3.9 mEq/L (ref 3.5–5.1)
SODIUM: 137 meq/L (ref 135–145)
Total Protein: 7.3 g/dL (ref 6.0–8.3)

## 2013-12-06 LAB — VITAMIN D 25 HYDROXY (VIT D DEFICIENCY, FRACTURES): VITD: 24.46 ng/mL — AB (ref 30.00–100.00)

## 2013-12-06 LAB — LIPID PANEL
Cholesterol: 174 mg/dL (ref 0–200)
HDL: 28.7 mg/dL — ABNORMAL LOW (ref >39.00)
NONHDL: 145.3
Total CHOL/HDL Ratio: 6
Triglycerides: 305 mg/dL — ABNORMAL HIGH (ref 0.0–149.0)
VLDL: 61 mg/dL — AB (ref 0.0–40.0)

## 2013-12-06 LAB — LDL CHOLESTEROL, DIRECT: Direct LDL: 111.4 mg/dL

## 2013-12-06 LAB — HEMOGLOBIN A1C: Hgb A1c MFr Bld: 5.2 % (ref 4.6–6.5)

## 2013-12-06 LAB — TSH: TSH: 2.51 u[IU]/mL (ref 0.35–4.50)

## 2013-12-06 MED ORDER — HYDROCHLOROTHIAZIDE 12.5 MG PO CAPS: 12.5000 mg | ORAL_CAPSULE | Freq: Every day | ORAL | Status: DC

## 2013-12-06 NOTE — Assessment & Plan Note (Signed)
BP Readings from Last 3 Encounters:  12/06/13 124/86  02/14/13 126/70  02/02/13 128/82   BP generally well controlled. Encouraged her to monitor BP at home. Continue HCTZ.

## 2013-12-06 NOTE — Patient Instructions (Signed)
Labs today.  Follow up for physical in 3 months.

## 2013-12-06 NOTE — Progress Notes (Signed)
Pre visit review using our clinic review tool, if applicable. No additional management support is needed unless otherwise documented below in the visit note. 

## 2013-12-06 NOTE — Assessment & Plan Note (Signed)
Will check lipids and LFTs with labs today. Discussed Mediterranean style diet and exercise.

## 2013-12-06 NOTE — Progress Notes (Signed)
Subjective:    Patient ID: Rhonda Kirby, female    DOB: 07-Aug-1970, 43 y.o.   MRN: [id number]  HPI 43YO female presents for follow up.  HTN - not checking BP at home. Feeling well. Compliant with meds. No chest pain, headache, palpitations.  Walking 2 miles 4x per week. Notes some recent dietary indiscretion. Planning to get focused back on weight loss.  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  Review of Systems  Constitutional: Negative for fever, chills, appetite change, fatigue and unexpected weight change.  Eyes: Negative for visual disturbance.  Respiratory: Negative for shortness of breath.   Cardiovascular: Negative for chest pain and leg swelling.  Gastrointestinal: Negative for abdominal pain.  Musculoskeletal: Negative for arthralgias and myalgias.  Skin: Negative for color change and rash.  Neurological: Negative for headaches.  Hematological: Negative for adenopathy. Does not bruise/bleed easily.  Psychiatric/Behavioral: Negative for dysphoric mood. The patient is not nervous/anxious.        Objective:    BP 124/86  Pulse 82  Temp(Src) 98.1 F (36.7 C) (Oral)  Ht 5' 7.75" (1.721 m)  Wt 237 lb (107.502 kg)  BMI 36.30 kg/m2  SpO2 97%  LMP 12/05/2013 Physical Exam  Constitutional: She is oriented to person, place, and time. She appears well-developed and well-nourished. No distress.  HENT:  Head: Normocephalic and atraumatic.  Right Ear: External ear normal.  Left Ear: External ear normal.  Nose: Nose normal.  Mouth/Throat: Oropharynx is clear and moist. No oropharyngeal exudate.  Eyes: Conjunctivae are normal. Pupils are equal, round, and reactive to light. Right eye exhibits no discharge. Left eye exhibits no discharge. No scleral icterus.  Neck: Normal range of motion. Neck supple. No tracheal deviation present. No thyromegaly present.  Cardiovascular: Normal rate, regular rhythm, normal heart sounds and intact distal pulses.   Exam reveals no gallop and no friction rub.   No murmur heard. Pulmonary/Chest: Effort normal and breath sounds normal. No accessory muscle usage. Not tachypneic. No respiratory distress. She has no decreased breath sounds. She has no wheezes. She has no rhonchi. She has no rales. She exhibits no tenderness.  Musculoskeletal: Normal range of motion. She exhibits no edema and no tenderness.       Hands: Lymphadenopathy:    She has no cervical adenopathy.  Neurological: She is alert and oriented to person, place, and time. No cranial nerve deficit. She exhibits normal muscle tone. Coordination normal.  Skin: Skin is warm and dry. No rash noted. She is not diaphoretic. No erythema. No pallor.  Psychiatric: She has a normal mood and affect. Her behavior is normal. Judgment and thought content normal.          Assessment & Plan:   Problem List Items Addressed This Visit     Unprioritized   Essential hypertension, benign - Primary      BP Readings from Last 3 Encounters:  12/06/13 124/86  02/14/13 126/70  02/02/13 128/82   BP generally well controlled. Encouraged her to monitor BP at home. Continue HCTZ.    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  Reviewed path reports from [facility] today.    Hypertriglyceridemia     Will check lipids and LFTs with labs today.    Relevant Medications      hydrochlorothiazide (MICROZIDE) 12.5 MG capsule   Other Relevant Orders      Lipid panel   Obesity      Wt Readings from Last 3 Encounters:  12/06/13 237 lb (107.502 kg)  02/14/13 224 lb (101.606 kg)  02/02/13 228 lb (103.42 kg)   Body mass index is 36.3 kg/(m^2). Encouraged healthy diet and exercise with goal of  weight loss.    Relevant Orders      Hemoglobin A1c      TSH   Steatohepatitis, non-alcoholic     Will check lipids and LFTs with labs today. Discussed Mediterranean style diet and exercise.        Return in about 3 months (around 03/08/2014) for Physical.

## 2013-12-06 NOTE — Assessment & Plan Note (Signed)
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 Reviewed path reports from [facility] today.

## 2013-12-06 NOTE — Assessment & Plan Note (Signed)
Will check lipids and LFTs with labs today. 

## 2013-12-06 NOTE — Assessment & Plan Note (Signed)
Wt Readings from Last 3 Encounters:  12/06/13 237 lb (107.502 kg)  02/14/13 224 lb (101.606 kg)  02/02/13 228 lb (103.42 kg)   Body mass index is 36.3 kg/(m^2). Encouraged healthy diet and exercise with goal of weight loss.

## 2013-12-07 ENCOUNTER — Telehealth: Payer: Self-pay | Admitting: Internal Medicine

## 2013-12-07 ENCOUNTER — Encounter: Payer: Self-pay | Admitting: Internal Medicine

## 2013-12-07 DIAGNOSIS — E785 Hyperlipidemia, unspecified: Secondary | ICD-10-CM

## 2013-12-07 NOTE — Telephone Encounter (Signed)
She will need CMP and lipids 272.4

## 2013-12-07 NOTE — Telephone Encounter (Signed)
Please see note.

## 2013-12-07 NOTE — Telephone Encounter (Signed)
Pt is requesting to have follow up blood drawn in Novemeber. There are no labs ordered in the system. Please advise

## 2013-12-07 NOTE — Telephone Encounter (Signed)
Relevant patient education assigned to patient using Emmi. ° °

## 2013-12-08 NOTE — Telephone Encounter (Signed)
Orders in 

## 2013-12-18 ENCOUNTER — Encounter: Payer: Self-pay | Admitting: Internal Medicine

## 2013-12-18 MED ORDER — ALPRAZOLAM 0.25 MG PO TABS: 0.2500 mg | ORAL_TABLET | Freq: Every evening | ORAL | Status: DC | PRN

## 2013-12-18 MED ORDER — HYDROCHLOROTHIAZIDE 12.5 MG PO CAPS: 12.5000 mg | ORAL_CAPSULE | Freq: Every day | ORAL | Status: DC

## 2013-12-18 NOTE — Telephone Encounter (Signed)
Ok refill? 

## 2013-12-18 NOTE — Telephone Encounter (Signed)
Fine to call in Rx.

## 2013-12-19 NOTE — Telephone Encounter (Signed)
Faxed Rx to pharmacy  

## 2014-02-23 ENCOUNTER — Encounter: Payer: Self-pay | Admitting: Internal Medicine

## 2014-03-05 ENCOUNTER — Encounter: Payer: Self-pay | Admitting: Internal Medicine

## 2014-03-14 ENCOUNTER — Other Ambulatory Visit: Payer: 59

## 2014-03-22 ENCOUNTER — Other Ambulatory Visit (INDEPENDENT_AMBULATORY_CARE_PROVIDER_SITE_OTHER): Payer: 59

## 2014-03-22 DIAGNOSIS — E785 Hyperlipidemia, unspecified: Secondary | ICD-10-CM

## 2014-03-22 LAB — COMPREHENSIVE METABOLIC PANEL
ALBUMIN: 4.4 g/dL (ref 3.5–5.2)
ALT: 65 U/L — ABNORMAL HIGH (ref 0–35)
AST: 43 U/L — ABNORMAL HIGH (ref 0–37)
Alkaline Phosphatase: 55 U/L (ref 39–117)
BUN: 14 mg/dL (ref 6–23)
CALCIUM: 9.3 mg/dL (ref 8.4–10.5)
CHLORIDE: 103 meq/L (ref 96–112)
CO2: 27 meq/L (ref 19–32)
Creatinine, Ser: 0.8 mg/dL (ref 0.4–1.2)
GFR: 87.96 mL/min (ref >60.00)
GLUCOSE: 109 mg/dL — AB (ref 70–99)
POTASSIUM: 4 meq/L (ref 3.5–5.1)
Sodium: 137 mEq/L (ref 135–145)
Total Bilirubin: 1.1 mg/dL (ref 0.2–1.2)
Total Protein: 7.6 g/dL (ref 6.0–8.3)

## 2014-03-22 LAB — LIPID PANEL
CHOLESTEROL: 200 mg/dL (ref 0–200)
HDL: 29.2 mg/dL — AB (ref >39.00)
NONHDL: 170.8
Total CHOL/HDL Ratio: 7
Triglycerides: 300 mg/dL — ABNORMAL HIGH (ref 0.0–149.0)
VLDL: 60 mg/dL — ABNORMAL HIGH (ref 0.0–40.0)

## 2014-03-22 LAB — LDL CHOLESTEROL, DIRECT: LDL DIRECT: 121 mg/dL

## 2014-04-09 ENCOUNTER — Ambulatory Visit (INDEPENDENT_AMBULATORY_CARE_PROVIDER_SITE_OTHER): Payer: 59 | Admitting: Internal Medicine

## 2014-04-09 ENCOUNTER — Encounter: Payer: Self-pay | Admitting: Internal Medicine

## 2014-04-09 VITALS — BP 138/85 | HR 81 | Temp 98.1°F | Ht 67.75 in | Wt 234.0 lb

## 2014-04-09 DIAGNOSIS — K5909 Other constipation: Secondary | ICD-10-CM

## 2014-04-09 DIAGNOSIS — K7581 Nonalcoholic steatohepatitis (NASH): Secondary | ICD-10-CM

## 2014-04-09 DIAGNOSIS — K5900 Constipation, unspecified: Secondary | ICD-10-CM | POA: Insufficient documentation

## 2014-04-09 DIAGNOSIS — E781 Pure hyperglyceridemia: Secondary | ICD-10-CM

## 2014-04-09 NOTE — Assessment & Plan Note (Signed)
Recent change in BMs with constipation and occasional narrow caliber stools. Rectal exam as noted. Will set up GI evaluation for possible colonoscopy.

## 2014-04-09 NOTE — Assessment & Plan Note (Signed)
We discussed and reviewed labs showing persistent mild elevation of LFTs. Reviewed [place] from 01/2013 which showed steatohepatitis. Reviewed dietary recommendations and goal of weight loss. Recommended starting statin, however she would like to hold off for now. Will also set up evaluation with Dr. Gerald Dexter in GI given persistence of symptoms. Question if she might need any additional imaging. Question if she would benefit from Vit E supplementation and statin.

## 2014-04-09 NOTE — Assessment & Plan Note (Signed)
Reviewed labs which show elevated triglyercides and low HDL consistent with metabolic syndrome. Recommended statin medication. She would like to wait until her GI evaluation.

## 2014-04-09 NOTE — Patient Instructions (Signed)
Consider taking Probiotic such as Align.  We will set up GI evaluation.

## 2014-04-09 NOTE — Progress Notes (Signed)
Subjective:    Patient ID: Rhonda Kirby, female    DOB: March 15, 1971, 43 y.o.   MRN: [id number]  HPI 43YO female presents for acute visit.  Initially had diarrhea frequently after cholecystectomy. Then, in 01/2014, had constipation with no BM for 1 week. Had extremely thick stool. Started fiber and OTC laxatives such as Miralax with minimal improvement symptoms. Has noted narrowing of stool. No rectal pain. Occasional rectal bleeding after BM with hemorrhoids. Had fissure in the past. Feels incomplete evacuation. No previous colonoscopy. No family history of rectal cancer.  Also concerned about elevated LFTs. Ongoing >1 year. Previous US in 01/2013 showed steatohepatitis.  Past medical, surgical, family and social history per today's encounter.  Review of Systems  Constitutional: Negative for fever, chills, appetite change, fatigue and unexpected weight change.  Eyes: Negative for visual disturbance.  Respiratory: Negative for shortness of breath.   Cardiovascular: Negative for chest pain and leg swelling.  Gastrointestinal: Positive for constipation and anal bleeding. Negative for nausea, vomiting, abdominal pain, diarrhea and rectal pain.  Skin: Negative for color change and rash.  Hematological: Negative for adenopathy. Does not bruise/bleed easily.  Psychiatric/Behavioral: Negative for dysphoric mood. The patient is not nervous/anxious.        Objective:    BP 138/85 mmHg  Pulse 81  Temp(Src) 98.1 F (36.7 C) (Oral)  Ht 5' 7.75" (1.721 m)  Wt 234 lb (106.142 kg)  BMI 35.84 kg/m2  SpO2 100%  LMP 04/05/2014 Physical Exam  Constitutional: She is oriented to person, place, and time. She appears well-developed and well-nourished. No distress.  HENT:  Head: Normocephalic and atraumatic.  Right Ear: External ear normal.  Left Ear: External ear normal.  Nose: Nose normal.  Mouth/Throat: Oropharynx is clear and moist. No oropharyngeal exudate.  Eyes: Conjunctivae are normal.  Pupils are equal, round, and reactive to light. Right eye exhibits no discharge. Left eye exhibits no discharge. No scleral icterus.  Neck: Normal range of motion. Neck supple. No tracheal deviation present. No thyromegaly present.  Cardiovascular: Normal rate, regular rhythm, normal heart sounds and intact distal pulses.  Exam reveals no gallop and no friction rub.   No murmur heard. Pulmonary/Chest: Effort normal and breath sounds normal. No accessory muscle usage. No tachypnea. No respiratory distress. She has no decreased breath sounds. She has no wheezes. She has no rhonchi. She has no rales. She exhibits no tenderness.  Genitourinary: Rectal exam shows external hemorrhoid. Rectal exam shows no fissure, no mass, no tenderness and anal tone normal.     Musculoskeletal: Normal range of motion. She exhibits no edema or tenderness.  Lymphadenopathy:    She has no cervical adenopathy.  Neurological: She is alert and oriented to person, place, and time. No cranial nerve deficit. She exhibits normal muscle tone. Coordination normal.  Skin: Skin is warm and dry. No rash noted. She is not diaphoretic. No erythema. No pallor.  Psychiatric: She has a normal mood and affect. Her behavior is normal. Judgment and thought content normal.          Assessment & Plan:   Problem List Items Addressed This Visit      Unprioritized   Constipation    Recent change in BMs with constipation and occasional narrow caliber stools. Rectal exam as noted. Will set up GI evaluation for possible colonoscopy.    Relevant Orders      Ambulatory referral to Gastroenterology   Hypertriglyceridemia    Reviewed labs which show elevated triglyercides and low  HDL consistent with metabolic syndrome. Recommended statin medication. She would like to wait until her GI evaluation.    NASH (nonalcoholic steatohepatitis) - Primary    We discussed and reviewed labs showing persistent mild elevation of LFTs. Reviewed [place] from  01/2013 which showed steatohepatitis. Reviewed dietary recommendations and goal of weight loss. Recommended starting statin, however she would like to hold off for now. Will also set up evaluation with Dr. Gerald Dexter in GI given persistence of symptoms. Question if she might need any additional imaging. Question if she would benefit from Vit E supplementation and statin.    Relevant Orders      Ambulatory referral to Gastroenterology       Return in about 4 weeks (around 05/07/2014) for Recheck.

## 2014-04-09 NOTE — Progress Notes (Signed)
Pre visit review using our clinic review tool, if applicable. No additional management support is needed unless otherwise documented below in the visit note. 

## 2014-04-11 ENCOUNTER — Encounter: Payer: Self-pay | Admitting: Internal Medicine

## 2014-04-13 ENCOUNTER — Encounter: Payer: Self-pay | Admitting: Internal Medicine

## 2014-04-16 ENCOUNTER — Telehealth: Payer: Self-pay

## 2014-04-16 NOTE — Telephone Encounter (Signed)
The patient called and is hoping to get an update on her referral to [facility]

## 2014-04-19 ENCOUNTER — Encounter: Payer: Self-pay | Admitting: Internal Medicine

## 2014-04-28 ENCOUNTER — Encounter: Payer: Self-pay | Admitting: Internal Medicine

## 2014-04-30 MED ORDER — HYDROCHLOROTHIAZIDE 12.5 MG PO CAPS: 12.5000 mg | ORAL_CAPSULE | Freq: Every day | ORAL | Status: DC

## 2014-06-06 ENCOUNTER — Encounter: Payer: Self-pay | Admitting: Internal Medicine

## 2014-06-07 MED ORDER — HYDROCHLOROTHIAZIDE 12.5 MG PO CAPS: 12.5000 mg | ORAL_CAPSULE | Freq: Every day | ORAL | Status: DC

## 2014-07-18 ENCOUNTER — Encounter: Payer: Self-pay | Admitting: Internal Medicine

## 2014-07-19 ENCOUNTER — Encounter: Payer: Self-pay | Admitting: Internal Medicine

## 2014-07-25 ENCOUNTER — Encounter: Payer: Self-pay | Admitting: Internal Medicine

## 2014-08-03 ENCOUNTER — Telehealth: Payer: Self-pay

## 2014-08-03 DIAGNOSIS — Z1211 Encounter for screening for malignant neoplasm of colon: Secondary | ICD-10-CM

## 2014-08-03 NOTE — Telephone Encounter (Signed)
The patient called hoping a referral could be placed for her to have a colonoscopy done.

## 2014-08-05 NOTE — Telephone Encounter (Signed)
OK. I have placed referral. Most insurance will not cover until age 44, so she will need to look into her coverage. Also, she will need to let us know preferred provider.

## 2014-08-06 ENCOUNTER — Encounter: Payer: Self-pay | Admitting: Internal Medicine

## 2014-08-06 NOTE — Telephone Encounter (Signed)
Left message on VM to return call with preferred provider for Colonoscopy.

## 2014-08-15 ENCOUNTER — Encounter: Payer: Self-pay | Admitting: Internal Medicine

## 2014-08-24 NOTE — Op Note (Signed)
PATIENT NAME:  Rhonda Kirby, Rhonda Kirby MR#:  [id number] DATE OF BIRTH:  May 21, 1970  DATE OF PROCEDURE:  02/07/2013  PREOPERATIVE DIAGNOSIS: Chronic cholecystitis.   POSTOPERATIVE DIAGNOSIS: Chronic cholecystitis, with left ovarian cyst.   OPERATIVE PROCEDURE: Laparoscopic cholecystectomy with intraoperative cholangiograms.   SURGEON: Job Founds.   ANESTHESIA: General endotracheal, under Dr. Marcello Moores.   ESTIMATED BLOOD LOSS: Less than 5 mL.   CLINICAL NOTE: This 44 year old woman has had episodic right upper quadrant pain, consistent with a biliary source. Ultrasound was negative, but a HIDA scan showed a markedly diminished ejection fraction of 31%. Symptoms were reproduced with the fatty meal provided during the HIDA scan. She was felt to be a candidate for cholecystectomy.   OPERATIVE NOTE: With the patient under adequate general endotracheal anesthesia, the abdomen was prepped with ChloraPrep and draped. A Veress needle was placed through a transumbilical incision. Aspiration yielded a small amount of thin fluid. The needle was repositioned and a small amount of fluid was again obtained. No bladder or stool contents.   The abdomen was insufflated with CO2 at 10 mmHg pressure. A 10 mm step port was expanded. There was a small puncture area in the posterior aspect of the uterus and a small amount of fluid in the pelvis but no evidence of a small bowel injury. There was a 2.5 to 3 cm cyst on the left ovary.   Attention was turned to the right upper quadrant. The patient was placed in reverse Trendelenburg position. An 11 mm Xcel port was placed in the epigastrium. Two 5-mm step ports were placed laterally. The gallbladder was a dusky green color. Moderate inflammation near the neck of the gallbladder. The gallbladder was placed on cephalad traction. The neck of the gallbladder was cleared and fluoroscopic cholangiograms completed using 9 mL of one-half strength Conray 60. This showed prompt filling of  the right and left hepatic ducts and free flow into the duodenum. No evidence of retained stones. The cystic duct and branches of the cystic artery were doubly-clipped adjacent to the gallbladder. The gallbladder was then removed from the liver bed making use of hook cautery dissection. It was delivered through the umbilical port site without incident.   Inspection from the epigastric site showed no evidence of organ injury except the small puncture from the Veress needle on the back of the uterus, which was not bleeding. Photos of both the right and left ovaries were taken and a copy provided to the patient to provide her GYN at [facility] with. The right upper quadrant was irrigated with lactated Ringer's solution. Good hemostasis was noted. The abdomen was then desufflated and ports removed under direct vision. Skin incisions were closed with 4-0 Vicryl subcuticular sutures. Benzoin, Steri-Strips, Telfa, and Tegaderm dressings were applied.   The patient tolerated the procedure well and was taken to the recovery room in stable condition.    ____________________________ Robert Bellow, MD jwb:dm D: 02/07/2013 14:17:34 ET T: 02/07/2013 14:46:06 ET JOB#: [id number]  cc: Robert Bellow, MD, <Dictator> Eduard Clos. Gilford Rile, MD Jourdan Durbin Amedeo Kinsman MD ELECTRONICALLY SIGNED 02/08/2013 8:14

## 2014-09-28 ENCOUNTER — Encounter: Payer: Self-pay | Admitting: Internal Medicine

## 2014-10-11 ENCOUNTER — Encounter: Payer: Self-pay | Admitting: Internal Medicine

## 2014-10-11 ENCOUNTER — Ambulatory Visit (INDEPENDENT_AMBULATORY_CARE_PROVIDER_SITE_OTHER): Payer: 59 | Admitting: Internal Medicine

## 2014-10-11 VITALS — BP 130/66 | HR 68 | Ht 67.0 in | Wt 214.4 lb

## 2014-10-11 DIAGNOSIS — R194 Change in bowel habit: Secondary | ICD-10-CM

## 2014-10-11 DIAGNOSIS — R140 Abdominal distension (gaseous): Secondary | ICD-10-CM | POA: Diagnosis not present

## 2014-10-11 DIAGNOSIS — K5900 Constipation, unspecified: Secondary | ICD-10-CM

## 2014-10-11 MED ORDER — NA SULFATE-K SULFATE-MG SULF 17.5-3.13-1.6 GM/177ML PO SOLN: 1.0000 | Freq: Once | ORAL | Status: DC

## 2014-10-11 MED ORDER — ALIGN PO CAPS: 1.0000 | ORAL_CAPSULE | Freq: Every day | ORAL | Status: AC

## 2014-10-11 NOTE — Progress Notes (Signed)
HISTORY OF PRESENT ILLNESS:  Rhonda Kirby is a 44 y.o. female with hypertension, obesity, and fatty liver disease (followed at [facility]) who is sent today by her primary care physician Dr. Ronette Deter regarding lower abdominal complaints manifested by change in bowel habits and increased intestinal gas. Patient reports having undergone cholecystectomy October 2014. Operative report shows chronic cholecystitis. Negative intraoperative cholangiogram. Initially she described loose stools. However, around October 2015 she developed difficulty evacuating her bowel. Constipation of sorts. Initiated Citrucel which helped somewhat. However, she continues to strain to defecate. More recently has noticed change in caliber of stools describing them is more narrow. There has been no associated abdominal pain or bleeding. No unexplained weight loss. She has however lost weight intentionally with dietary change. She has had increased flatus which she describes as foul-smelling. No new medications or change in diet prior to the development of symptoms. She denies a family history of colon cancer or celiac disease. Review of outside records including x-rays and laboratories. November 2015 revealed mild elevated transaminases and lipids.  REVIEW OF SYSTEMS:  All non-GI ROS negative entirely upon comprehensive review  Past Medical History  Diagnosis Date  . Herpes genitalis   . Hypertension   . Fatty liver   . 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 Hypertriglyceridemia     Past Surgical History  Procedure Laterality Date  . Tonsillectomy  1976  . Tubal ligation  2008  . Cesarean section  2003, 2008  . Cholecystectomy  02/07/13    Social History Rhonda Kirby  reports that she quit smoking about 14 years ago. She has never used smokeless tobacco. She reports that she drinks alcohol. She reports that she does not use illicit drugs.  family history includes Hypertension in her maternal  grandmother and mother.  No Known Allergies     PHYSICAL EXAMINATION: Vital signs: BP 130/66 mmHg  Pulse 68  Ht 5\' 7"  (1.702 m)  Wt 214 lb 6 oz (97.24 kg)  BMI 33.57 kg/m2  Constitutional: Pleasant, obese, generally well-appearing, no acute distress Psychiatric: alert and oriented x3, cooperative Eyes: extraocular movements intact, anicteric, conjunctiva pink Mouth: oral pharynx moist, no lesions Neck: supple no lymphadenopathy Cardiovascular: heart regular rate and rhythm, no murmur Lungs: clear to auscultation bilaterally Abdomen: soft, obese, nontender, nondistended, no obvious ascites, no peritoneal signs, normal bowel sounds, no organomegaly Rectal: Deferred until colonoscopy Extremities: no lower extremity edema bilaterally Skin: no lesions on visible extremities Neuro: No focal deficits.   ASSESSMENT:  #1. Change in bowel habits with tendency toward constipation with straining and change in stool caliber. Rule out structural abnormality of the colon such as neoplasia #2. Increased intestinal gas as manifested by foul-smelling flatus. Rule out bacterial overgrowth #3. History of fatty liver disease. Followed at [place]:  #1. Colonoscopy to evaluate significant change in bowel habits as described.The nature of the procedure, as well as the risks, benefits, and alternatives were carefully and thoroughly reviewed with the patient. Ample time for discussion and questions allowed. The patient understood, was satisfied, and agreed to proceed. #2. Trial of probiotic Align one twice daily for 2 weeks to see if this helps with intestinal gas #3. Discussion on intestinal gas. Also, provided anti-gas and flatulence dietary sheet as well as educational brochure on intestinal gas for the patient's review #4. Continue with weight loss and exercise regarding fatty liver disease and overall general health  A copy of this consultation note has  been sent to Dr. Ronette Deter

## 2014-10-11 NOTE — Patient Instructions (Signed)
You have been scheduled for a colonoscopy. Please follow written instructions given to you at your visit today.  Please pick up your prep supplies at the pharmacy within the next 1-3 days. If you use inhalers (even only as needed), please bring them with you on the day of your procedure.   We have given you coupons for the probiotic Align. This puts good bacteria back into your colon. You should take 1 capsule by mouth twice daily for 2 weeks.  It can be purchased over the counter.

## 2014-10-23 ENCOUNTER — Encounter: Payer: 59 | Admitting: Internal Medicine

## 2014-12-19 ENCOUNTER — Encounter: Payer: 59 | Admitting: Internal Medicine

## 2015-01-08 ENCOUNTER — Encounter: Payer: Self-pay | Admitting: Internal Medicine

## 2015-01-08 ENCOUNTER — Ambulatory Visit (AMBULATORY_SURGERY_CENTER): Payer: 59 | Admitting: Internal Medicine

## 2015-01-08 VITALS — BP 117/76 | HR 66 | Temp 98.8°F | Resp 12 | Ht 67.0 in | Wt 214.0 lb

## 2015-01-08 DIAGNOSIS — D122 Benign neoplasm of ascending colon: Secondary | ICD-10-CM

## 2015-01-08 DIAGNOSIS — R194 Change in bowel habit: Secondary | ICD-10-CM

## 2015-01-08 DIAGNOSIS — D124 Benign neoplasm of descending colon: Secondary | ICD-10-CM | POA: Diagnosis not present

## 2015-01-08 DIAGNOSIS — K635 Polyp of colon: Secondary | ICD-10-CM

## 2015-01-08 MED ORDER — SODIUM CHLORIDE 0.9 % IV SOLN: 500.0000 mL | INTRAVENOUS | Status: DC

## 2015-01-08 NOTE — Progress Notes (Signed)
To recovery, report to Mirts, RN, VSS. 

## 2015-01-08 NOTE — Op Note (Signed)
[name]. [place], [id number]   COLONOSCOPY PROCEDURE REPORT  PATIENT: Rhonda Kirby, Rhonda Kirby  MR#: [id number] BIRTHDATE: 1970/07/19 , 44  yrs. old GENDER: female ENDOSCOPIST: Eustace Quail, MD REFERRED OR:[id number] Gilford Rile, M.D. PROCEDURE DATE:  01/08/2015 PROCEDURE:   Colonoscopy, diagnostic and Colonoscopy with snare polypectomy X2 First Screening Colonoscopy - Avg.  risk and is 50 yrs.  old or older - No.  Prior Negative Screening - Now for repeat screening. N/A  History of Adenoma - Now for follow-up colonoscopy & has been > or = to 3 yrs.  N/A  Polyps removed today? Yes ASA CLASS:   Class II INDICATIONS:change in bowel habits(more difficult), stool caliber change(more narrow). MEDICATIONS: Monitored anesthesia care and Propofol 300 mg IV  DESCRIPTION OF PROCEDURE:   After the risks benefits and alternatives of the procedure were thoroughly explained, informed consent was obtained.  The digital rectal exam revealed no abnormalities of the rectum.   The LB [id number]  endoscope was introduced through the anus and advanced to the cecum, which was identified by both the appendix and ileocecal valve. No adverse events experienced.   The quality of the prep was excellent. (Suprep was used)  The instrument was then slowly withdrawn as the colon was fully examined. Estimated blood loss is zero unless otherwise noted in this procedure report.  COLON FINDINGS: Two polyps ranging between 3-2mm in size were found in the descending colon and ascending colon.  A polypectomy was performed with a cold snare.  The resection was complete, the polyp tissue was completely retrieved and sent to histology.   There was mild diverticulosis noted in the sigmoid colon.   The examination was otherwise normal.  Retroflexed views revealed internal hemorrhoids. The time to cecum = 2.7 Withdrawal time = 10.7   The scope was withdrawn and the procedure  completed. COMPLICATIONS: There were no immediate complications.  ENDOSCOPIC IMPRESSION: 1.   Two polyps were found in the descending colon and ascending colon; polypectomy was performed with a cold snare 2.   Mild diverticulosis was noted in the sigmoid colon 3.   The examination was otherwise normal  RECOMMENDATIONS: 1. Repeat colonoscopy in 5 years if polyp adenomatous; otherwise 10 years  eSigned:  Eustace Quail, MD 01/08/2015 10:47 AM   cc: The Patient and Ronette Deter MD

## 2015-01-08 NOTE — Progress Notes (Signed)
Called to room to assist during endoscopic procedure.  Patient ID and intended procedure confirmed with present staff. Received instructions for my participation in the procedure from the performing physician.  

## 2015-01-08 NOTE — Patient Instructions (Signed)
YOU HAD AN ENDOSCOPIC PROCEDURE TODAY AT THE [facility]:   Refer to the procedure report that was given to you for any specific questions about what was found during the examination.  If the procedure report does not answer your questions, please call your gastroenterologist to clarify.  If you requested that your care partner not be given the details of your procedure findings, then the procedure report has been included in a sealed envelope for you to review at your convenience later.  YOU SHOULD EXPECT: Some feelings of bloating in the abdomen. Passage of more gas than usual.  Walking can help get rid of the air that was put into your GI tract during the procedure and reduce the bloating. If you had a lower endoscopy (such as a colonoscopy or flexible sigmoidoscopy) you may notice spotting of blood in your stool or on the toilet paper. If you underwent a bowel prep for your procedure, you may not have a normal bowel movement for a few days.  Please Note:  You might notice some irritation and congestion in your nose or some drainage.  This is from the oxygen used during your procedure.  There is no need for concern and it should clear up in a day or so.  SYMPTOMS TO REPORT IMMEDIATELY:   Following lower endoscopy (colonoscopy or flexible sigmoidoscopy):  Excessive amounts of blood in the stool  Significant tenderness or worsening of abdominal pains  Swelling of the abdomen that is new, acute  Fever of 100F or higher   For urgent or emergent issues, a gastroenterologist can be reached at any hour by calling [phone].   DIET: Your first meal following the procedure should be a small meal and then it is ok to progress to your normal diet. Heavy or fried foods are harder to digest and may make you feel nauseous or bloated.  Likewise, meals heavy in dairy and vegetables can increase bloating.  Drink plenty of fluids but you should avoid alcoholic beverages for 24  hours.  ACTIVITY:  You should plan to take it easy for the rest of today and you should NOT DRIVE or use heavy machinery until tomorrow (because of the sedation medicines used during the test).    FOLLOW UP: Our staff will call the number listed on your records the next business day following your procedure to check on you and address any questions or concerns that you may have regarding the information given to you following your procedure. If we do not reach you, we will leave a message.  However, if you are feeling well and you are not experiencing any problems, there is no need to return our call.  We will assume that you have returned to your regular daily activities without incident.  If any biopsies were taken you will be contacted by phone or by letter within the next 1-3 weeks.  Please call us at [phone] if you have not heard about the biopsies in 3 weeks.    SIGNATURES/CONFIDENTIALITY: You and/or your care partner have signed paperwork which will be entered into your electronic medical record.  These signatures attest to the fact that that the information above on your After Visit Summary has been reviewed and is understood.  Full responsibility of the confidentiality of this discharge information lies with you and/or your care-partner.  Polyps, diverticulosis, high fiber diet-handouts given  Repeat colonoscopy will be determined by pathology.  

## 2015-01-09 ENCOUNTER — Telehealth: Payer: Self-pay

## 2015-01-09 NOTE — Telephone Encounter (Signed)
  Follow up Call-  Call back number 01/08/2015  Post procedure Call Back phone  # [phone]  Permission to leave phone message Yes     Patient questions:  Do you have a fever, pain , or abdominal swelling? No. Pain Score  0 *  Have you tolerated food without any problems? Yes.    Have you been able to return to your normal activities? Yes.    Do you have any questions about your discharge instructions: Diet   No. Medications  No. Follow up visit  No.  Do you have questions or concerns about your Care? No.  Actions: * If pain score is 4 or above: No action needed, pain <4.  No problems per the pt. maw

## 2015-01-14 ENCOUNTER — Encounter: Payer: Self-pay | Admitting: Internal Medicine

## 2015-01-21 ENCOUNTER — Encounter: Payer: Self-pay | Admitting: Internal Medicine

## 2015-01-22 ENCOUNTER — Other Ambulatory Visit: Payer: Self-pay | Admitting: *Deleted

## 2015-01-22 MED ORDER — HYDROCHLOROTHIAZIDE 12.5 MG PO CAPS: 12.5000 mg | ORAL_CAPSULE | Freq: Every day | ORAL | Status: DC

## 2015-01-23 ENCOUNTER — Other Ambulatory Visit: Payer: Self-pay

## 2015-01-23 DIAGNOSIS — Z1231 Encounter for screening mammogram for malignant neoplasm of breast: Secondary | ICD-10-CM

## 2015-03-15 ENCOUNTER — Ambulatory Visit
Admission: RE | Admit: 2015-03-15 | Discharge: 2015-03-15 | Disposition: A | Discharge status: Home / Self Care | Payer: 59 | Source: Ambulatory Visit

## 2015-03-15 ENCOUNTER — Ambulatory Visit: Payer: 59

## 2015-03-15 DIAGNOSIS — Z1231 Encounter for screening mammogram for malignant neoplasm of breast: Secondary | ICD-10-CM

## 2015-03-18 ENCOUNTER — Ambulatory Visit: Payer: Self-pay | Admitting: Internal Medicine

## 2015-04-16 ENCOUNTER — Encounter: Payer: Self-pay | Admitting: Internal Medicine

## 2015-05-14 ENCOUNTER — Ambulatory Visit: Payer: 59 | Admitting: Internal Medicine

## 2015-05-14 ENCOUNTER — Ambulatory Visit: Payer: Self-pay | Admitting: Internal Medicine

## 2015-05-28 ENCOUNTER — Ambulatory Visit (INDEPENDENT_AMBULATORY_CARE_PROVIDER_SITE_OTHER): Payer: 59 | Admitting: Internal Medicine

## 2015-05-28 ENCOUNTER — Encounter: Payer: Self-pay | Admitting: Internal Medicine

## 2015-05-28 VITALS — BP 133/84 | HR 79 | Temp 97.6°F | Ht 67.75 in | Wt 230.4 lb

## 2015-05-28 DIAGNOSIS — E669 Obesity, unspecified: Secondary | ICD-10-CM | POA: Diagnosis not present

## 2015-05-28 DIAGNOSIS — I10 Essential (primary) hypertension: Secondary | ICD-10-CM

## 2015-05-28 DIAGNOSIS — K7581 Nonalcoholic steatohepatitis (NASH): Secondary | ICD-10-CM | POA: Diagnosis not present

## 2015-05-28 DIAGNOSIS — Z0000 Encounter for general adult medical examination without abnormal findings: Secondary | ICD-10-CM

## 2015-05-28 MED ORDER — HYDROCHLOROTHIAZIDE 12.5 MG PO CAPS: 12.5000 mg | ORAL_CAPSULE | Freq: Every day | ORAL | Status: AC

## 2015-05-28 MED ORDER — ALPRAZOLAM 0.25 MG PO TABS: 0.2500 mg | ORAL_TABLET | Freq: Every evening | ORAL | Status: AC | PRN

## 2015-05-28 NOTE — Assessment & Plan Note (Signed)
Reviewed notes from Dr. Gerald Dexter. Encouraged Mediterranean diet and exercise. Will repeat LFTs fasting next month.

## 2015-05-28 NOTE — Assessment & Plan Note (Signed)
Wt Readings from Last 3 Encounters:  05/28/15 230 lb 6 oz (104.497 kg)  01/08/15 214 lb (97.07 kg)  10/11/14 214 lb 6 oz (97.24 kg)   Body mass index is 35.28 kg/(m^2). Encouraged healthy diet and exercise.

## 2015-05-28 NOTE — Progress Notes (Signed)
Subjective:    Patient ID: Rhonda Kirby, female    DOB: 1970-10-31, 44 y.o.   MRN: [id number]  HPI  45YO female presents for follow up. Last seen in Dec 2015.  NASH - Seen by Dr. Gerald Dexter in 07/2014. Now has follow up in 06/2015. Notes some recent dietary indiscretion. Weight up about 40lb compared with visit with Dr. Gerald Dexter last year. Planning to get back on track with healthy diet and exercise.  Started new job in [place] into a new home.  HTN - Compliant with medication. No CP, HA.  Anxiety - occasionally wakes in night with panic attacks. Takes Alprazolam for this with improvement in symptoms. Has not used the 30tabs yet from 2015 refill. Brought bottle today to dispose of as this is expired.   Wt Readings from Last 3 Encounters:  05/28/15 230 lb 6 oz (104.497 kg)  01/08/15 214 lb (97.07 kg)  10/11/14 214 lb 6 oz (97.24 kg)   BP Readings from Last 3 Encounters:  05/28/15 133/84  01/08/15 117/76  10/11/14 130/66    Past Medical History  Diagnosis Date  . Herpes genitalis   . Hypertension   . Fatty liver   . 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 Hypertriglyceridemia    Family History  Problem Relation Age of Onset  . Hypertension Mother   . Colon polyps Mother   . Liver disease Mother   . Hypertension Maternal Grandmother   . Colon polyps Maternal Grandmother   . Liver disease Maternal Grandmother   . Colon polyps Maternal Uncle    Past Surgical History  Procedure Laterality Date  . Tonsillectomy  1976  . Tubal ligation  2008  . Cesarean section  2003, 2008  . Cholecystectomy  02/07/13  . Finger surgery      RIGHT THUMB   Social History   Social History  . Marital Status: Married    Spouse Name: N/A  . Number of Children: N/A  . Years of Education: N/A   Occupational History  . Claims Processor     Social History Main Topics  . Smoking status: Former Smoker -- 1.00 packs/day for 10 years    Quit date: 05/04/2000  . Smokeless  tobacco: Never Used  . Alcohol Use: 0.0 oz/week    0 Standard drinks or equivalent per week     Comment: occ  . Drug Use: No  . Sexual Activity: Not Asked   Other Topics Concern  . None   Social History Narrative    Review of Systems  Constitutional: Negative for fever, chills, appetite change, fatigue and unexpected weight change.  Eyes: Negative for visual disturbance.  Respiratory: Negative for shortness of breath.   Cardiovascular: Negative for chest pain and leg swelling.  Gastrointestinal: Negative for nausea, vomiting, abdominal pain, diarrhea and constipation.  Musculoskeletal: Negative for myalgias and arthralgias.  Skin: Negative for color change and rash.  Hematological: Negative for adenopathy. Does not bruise/bleed easily.  Psychiatric/Behavioral: Negative for sleep disturbance and dysphoric mood. The patient is not nervous/anxious.        Objective:    BP 133/84 mmHg  Pulse 79  Temp(Src) 97.6 F (36.4 C) (Oral)  Ht 5' 7.75" (1.721 m)  Wt 230 lb 6 oz (104.497 kg)  BMI 35.28 kg/m2  SpO2 99%  LMP 05/25/2015 Physical Exam  Constitutional: She is oriented to person, place, and time. She appears well-developed and well-nourished. No distress.  HENT:  Head: Normocephalic  and atraumatic.  Right Ear: External ear normal.  Left Ear: External ear normal.  Nose: Nose normal.  Mouth/Throat: Oropharynx is clear and moist. No oropharyngeal exudate.  Eyes: Conjunctivae and EOM are normal. Pupils are equal, round, and reactive to light. Right eye exhibits no discharge. Left eye exhibits no discharge. No scleral icterus.  Neck: Normal range of motion. Neck supple. No tracheal deviation present. No thyromegaly present.  Cardiovascular: Normal rate, regular rhythm, normal heart sounds and intact distal pulses.  Exam reveals no gallop and no friction rub.   No murmur heard. Pulmonary/Chest: Effort normal and breath sounds normal. No respiratory distress. She has no wheezes.  She has no rales. She exhibits no tenderness.  Abdominal: Soft. Bowel sounds are normal. She exhibits no distension and no mass. There is no tenderness. There is no rebound and no guarding.  Musculoskeletal: Normal range of motion. She exhibits no edema or tenderness.  Lymphadenopathy:    She has no cervical adenopathy.  Neurological: She is alert and oriented to person, place, and time. No cranial nerve deficit. She exhibits normal muscle tone. Coordination normal.  Skin: Skin is warm and dry. No rash noted. She is not diaphoretic. No erythema. No pallor.  Psychiatric: She has a normal mood and affect. Her behavior is normal. Judgment and thought content normal.          Assessment & Plan:   Problem List Items Addressed This Visit      Unprioritized   Essential hypertension, benign    BP Readings from Last 3 Encounters:  05/28/15 133/84  01/08/15 117/76  10/11/14 130/66   BP well controlled. Renal function with fasting labs next month.  Continue HCTZ.      Relevant Medications   hydrochlorothiazide (MICROZIDE) 12.5 MG capsule   NASH (nonalcoholic steatohepatitis) - Primary    Reviewed notes from Dr. Gerald Dexter. Encouraged Mediterranean diet and exercise. Will repeat LFTs fasting next month.      Obesity    Wt Readings from Last 3 Encounters:  05/28/15 230 lb 6 oz (104.497 kg)  01/08/15 214 lb (97.07 kg)  10/11/14 214 lb 6 oz (97.24 kg)   Body mass index is 35.28 kg/(m^2). Encouraged healthy diet and exercise.       Other Visit Diagnoses    Routine general medical examination at a health care facility        Relevant Orders    Comprehensive metabolic panel    Lipid panel    CBC with Differential/Platelet    Hemoglobin A1c    TSH        Return in about 6 months (around 11/25/2015) for Recheck.

## 2015-05-28 NOTE — Assessment & Plan Note (Signed)
BP Readings from Last 3 Encounters:  05/28/15 133/84  01/08/15 117/76  10/11/14 130/66   BP well controlled. Renal function with fasting labs next month.  Continue HCTZ.

## 2015-05-28 NOTE — Patient Instructions (Addendum)
Fasting labs next month. Start back on Mediterranean style diet.     Why follow it? Research shows. . Those who follow the Mediterranean diet have a reduced risk of heart disease  . The diet is associated with a reduced incidence of Parkinson's and Alzheimer's diseases . People following the diet may have longer life expectancies and lower rates of chronic diseases  . The Dietary Guidelines for Americans recommends the Mediterranean diet as an eating plan to promote health and prevent disease  What Is the Mediterranean Diet?  . Healthy eating plan based on typical foods and recipes of Mediterranean-style cooking . The diet is primarily a plant based diet; these foods should make up a majority of meals   Starches - Plant based foods should make up a majority of meals - They are an important sources of vitamins, minerals, energy, antioxidants, and fiber - Choose whole grains, foods high in fiber and minimally processed items  - Typical grain sources include wheat, oats, barley, corn, brown rice, bulgar, farro, millet, polenta, couscous  - Various types of beans include chickpeas, lentils, fava beans, black beans, white beans   Fruits  Veggies - Large quantities of antioxidant rich fruits & veggies; 6 or more servings  - Vegetables can be eaten raw or lightly drizzled with oil and cooked  - Vegetables common to the traditional Mediterranean Diet include: artichokes, arugula, beets, broccoli, brussel sprouts, cabbage, carrots, celery, collard greens, cucumbers, eggplant, kale, leeks, lemons, lettuce, mushrooms, okra, onions, peas, peppers, potatoes, pumpkin, radishes, rutabaga, shallots, spinach, sweet potatoes, turnips, zucchini - Fruits common to the Mediterranean Diet include: apples, apricots, avocados, cherries, clementines, dates, figs, grapefruits, grapes, melons, nectarines, oranges, peaches, pears, pomegranates, strawberries, tangerines  Fats - Replace butter and margarine with healthy  oils, such as olive oil, canola oil, and tahini  - Limit nuts to no more than a handful a day  - Nuts include walnuts, almonds, pecans, pistachios, pine nuts  - Limit or avoid candied, honey roasted or heavily salted nuts - Olives are central to the [employer] - can be eaten whole or used in a variety of dishes   Meats Protein - Limiting red meat: no more than a few times a month - When eating red meat: choose lean cuts and keep the portion to the size of deck of cards - Eggs: approx. 0 to 4 times a week  - Fish and lean poultry: at least 2 a week  - Healthy protein sources include, chicken, [place], lean beef, lamb - Increase intake of seafood such as tuna, salmon, trout, mackerel, shrimp, scallops - Avoid or limit high fat processed meats such as sausage and bacon  Dairy - Include moderate amounts of low fat dairy products  - Focus on healthy dairy such as fat free yogurt, skim milk, low or reduced fat cheese - Limit dairy products higher in fat such as whole or 2% milk, cheese, ice cream  Alcohol - Moderate amounts of red wine is ok  - No more than 5 oz daily for women (all ages) and men older than age 13  - No more than 10 oz of wine daily for men younger than 10  Other - Limit sweets and other desserts  - Use herbs and spices instead of salt to flavor foods  - Herbs and spices common to the traditional Mediterranean Diet include: basil, bay leaves, chives, cloves, cumin, fennel, garlic, lavender, marjoram, mint, oregano, parsley, pepper, rosemary, sage, savory, sumac, tarragon, thyme  It's not just a diet, it's a lifestyle:  . The Mediterranean diet includes lifestyle factors typical of those in the region  . Foods, drinks and meals are best eaten with others and savored . Daily physical activity is important for overall good health . This could be strenuous exercise like running and aerobics . This could also be more leisurely activities such as walking, housework, yard-work,  or taking the stairs . Moderation is the key; a balanced and healthy diet accommodates most foods and drinks . Consider portion sizes and frequency of consumption of certain foods   Meal Ideas & Options:  . Breakfast:  o Whole wheat toast or whole wheat English muffins with peanut butter & hard boiled egg o Steel cut oats topped with apples & cinnamon and skim milk  o Fresh fruit: banana, strawberries, melon, berries, peaches  o Smoothies: strawberries, bananas, greek yogurt, peanut butter o Low fat greek yogurt with blueberries and granola  o Egg white omelet with spinach and mushrooms o Breakfast couscous: whole wheat couscous, apricots, skim milk, cranberries  . Sandwiches:  o Hummus and grilled vegetables (peppers, zucchini, squash) on whole wheat bread   o Grilled chicken on whole wheat pita with lettuce, tomatoes, cucumbers or tzatziki  o Tuna salad on whole wheat bread: tuna salad made with greek yogurt, olives, red peppers, capers, green onions o Garlic rosemary lamb pita: lamb sauted with garlic, rosemary, salt & pepper; add lettuce, cucumber, greek yogurt to pita - flavor with lemon juice and black pepper  . Seafood:  o Mediterranean grilled salmon, seasoned with garlic, basil, parsley, lemon juice and black pepper o Shrimp, lemon, and spinach whole-grain pasta salad made with low fat greek yogurt  o Seared scallops with lemon orzo  o Seared tuna steaks seasoned salt, pepper, coriander topped with tomato mixture of olives, tomatoes, olive oil, minced garlic, parsley, green onions and cappers  . Meats:  o Herbed greek chicken salad with kalamata olives, cucumber, feta  o Red bell peppers stuffed with spinach, bulgur, lean ground beef (or lentils) & topped with feta   o Kebabs: skewers of chicken, tomatoes, onions, zucchini, squash  o [place] burgers: made with red onions, mint, dill, lemon juice, feta cheese topped with roasted red peppers . Vegetarian o Cucumber salad:  cucumbers, artichoke hearts, celery, red onion, feta cheese, tossed in olive oil & lemon juice  o Hummus and whole grain pita points with a greek salad (lettuce, tomato, feta, olives, cucumbers, red onion) o Lentil soup with celery, carrots made with vegetable broth, garlic, salt and pepper  o Tabouli salad: parsley, bulgur, mint, scallions, cucumbers, tomato, radishes, lemon juice, olive oil, salt and pepper.

## 2015-05-28 NOTE — Progress Notes (Signed)
Pre visit review using our clinic review tool, if applicable. No additional management support is needed unless otherwise documented below in the visit note. 

## 2015-11-12 ENCOUNTER — Ambulatory Visit (INDEPENDENT_AMBULATORY_CARE_PROVIDER_SITE_OTHER): Payer: 59 | Admitting: Internal Medicine

## 2015-11-12 ENCOUNTER — Encounter: Payer: Self-pay | Admitting: Internal Medicine

## 2015-11-12 ENCOUNTER — Other Ambulatory Visit: Payer: 59

## 2015-11-12 VITALS — BP 140/90 | HR 86 | Ht 68.0 in | Wt 235.4 lb

## 2015-11-12 DIAGNOSIS — E669 Obesity, unspecified: Secondary | ICD-10-CM

## 2015-11-12 DIAGNOSIS — R7989 Other specified abnormal findings of blood chemistry: Secondary | ICD-10-CM | POA: Diagnosis not present

## 2015-11-12 DIAGNOSIS — Z0000 Encounter for general adult medical examination without abnormal findings: Secondary | ICD-10-CM

## 2015-11-12 DIAGNOSIS — K7581 Nonalcoholic steatohepatitis (NASH): Secondary | ICD-10-CM | POA: Diagnosis not present

## 2015-11-12 DIAGNOSIS — I10 Essential (primary) hypertension: Secondary | ICD-10-CM | POA: Diagnosis not present

## 2015-11-12 LAB — COMPREHENSIVE METABOLIC PANEL
ALT: 58 U/L — ABNORMAL HIGH (ref 0–35)
AST: 36 U/L (ref 0–37)
Albumin: 4.4 g/dL (ref 3.5–5.2)
Alkaline Phosphatase: 48 U/L (ref 39–117)
BUN: 14 mg/dL (ref 6–23)
CHLORIDE: 103 meq/L (ref 96–112)
CO2: 28 mEq/L (ref 19–32)
Calcium: 9.6 mg/dL (ref 8.4–10.5)
Creatinine, Ser: 0.71 mg/dL (ref 0.40–1.20)
GFR: 94.44 mL/min (ref >60.00)
GLUCOSE: 102 mg/dL — AB (ref 70–99)
POTASSIUM: 4.1 meq/L (ref 3.5–5.1)
SODIUM: 138 meq/L (ref 135–145)
Total Bilirubin: 0.7 mg/dL (ref 0.2–1.2)
Total Protein: 7.4 g/dL (ref 6.0–8.3)

## 2015-11-12 LAB — LIPID PANEL
CHOL/HDL RATIO: 5
CHOLESTEROL: 169 mg/dL (ref 0–200)
HDL: 32 mg/dL — ABNORMAL LOW (ref >39.00)
NonHDL: 136.66
TRIGLYCERIDES: 271 mg/dL — AB (ref 0.0–149.0)
VLDL: 54.2 mg/dL — AB (ref 0.0–40.0)

## 2015-11-12 LAB — CBC WITH DIFFERENTIAL/PLATELET
BASOS PCT: 0.5 % (ref 0.0–3.0)
Basophils Absolute: 0 10*3/uL (ref 0.0–0.1)
EOS PCT: 2.1 % (ref 0.0–5.0)
Eosinophils Absolute: 0.2 10*3/uL (ref 0.0–0.7)
HCT: 45 % (ref 36.0–46.0)
HEMOGLOBIN: 15.5 g/dL — AB (ref 12.0–15.0)
LYMPHS ABS: 2.2 10*3/uL (ref 0.7–4.0)
Lymphocytes Relative: 24.7 % (ref 12.0–46.0)
MCHC: 34.5 g/dL (ref 30.0–36.0)
MCV: 85.4 fl (ref 78.0–100.0)
MONO ABS: 0.5 10*3/uL (ref 0.1–1.0)
Monocytes Relative: 5.6 % (ref 3.0–12.0)
NEUTROS ABS: 6.1 10*3/uL (ref 1.4–7.7)
NEUTROS PCT: 67.1 % (ref 43.0–77.0)
Platelets: 268 10*3/uL (ref 150.0–400.0)
RBC: 5.27 Mil/uL — ABNORMAL HIGH (ref 3.87–5.11)
RDW: 12.9 % (ref 11.5–15.5)
WBC: 9.1 10*3/uL (ref 4.0–10.5)

## 2015-11-12 LAB — LDL CHOLESTEROL, DIRECT: Direct LDL: 104 mg/dL

## 2015-11-12 LAB — TSH: TSH: 2.51 u[IU]/mL (ref 0.35–4.50)

## 2015-11-12 LAB — HEMOGLOBIN A1C: HEMOGLOBIN A1C: 5.1 % (ref 4.6–6.5)

## 2015-11-12 NOTE — Progress Notes (Signed)
Pre visit review using our clinic review tool, if applicable. No additional management support is needed unless otherwise documented below in the visit note. 

## 2015-11-12 NOTE — Assessment & Plan Note (Signed)
Wt Readings from Last 3 Encounters:  11/12/15 235 lb 6.4 oz (106.777 kg)  05/28/15 230 lb 6 oz (104.497 kg)  01/08/15 214 lb (97.07 kg)   Encouraged her to limit carbohydrate intake to 100gm daily. Encouraged exercise.

## 2015-11-12 NOTE — Assessment & Plan Note (Signed)
BP Readings from Last 3 Encounters:  11/12/15 140/90  05/28/15 133/84  01/08/15 117/76   BP elevated. Discussed adding either Metoprolol or Amlodipine. She prefers to hold off for now. Will follow up after her ENT visit.

## 2015-11-12 NOTE — Progress Notes (Signed)
Subjective:    Patient ID: Rhonda Kirby, female    DOB: 10/02/1970, 45 y.o.   MRN: [id number]  HPI  45YO female presents for follow up.  HTN - BP at home 150/90s. Improved with rest at home. On HCTZ.  Notes tinnitis described as "whooshing" sound recently. Plans to see ENT this week. Also notes pressure in head. Not taking anything for this.  NASH - has not followed up with [facility]. Worried about LFTs. Not currently following as healthy a diet as previous.  Obesity - Frustrated by weight gain. Not following any particular diet. Exercise limited by work and family time constraints.  Wt Readings from Last 3 Encounters:  11/12/15 235 lb 6.4 oz (106.777 kg)  05/28/15 230 lb 6 oz (104.497 kg)  01/08/15 214 lb (97.07 kg)   BP Readings from Last 3 Encounters:  11/12/15 140/90  05/28/15 133/84  01/08/15 117/76    Past Medical History  Diagnosis Date  . Herpes genitalis   . Hypertension   . Fatty liver   . 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 Hypertriglyceridemia    Family History  Problem Relation Age of Onset  . Hypertension Mother   . Colon polyps Mother   . Liver disease Mother   . Hypertension Maternal Grandmother   . Colon polyps Maternal Grandmother   . Liver disease Maternal Grandmother   . Colon polyps Maternal Uncle    Past Surgical History  Procedure Laterality Date  . Tonsillectomy  1976  . Tubal ligation  2008  . Cesarean section  2003, 2008  . Cholecystectomy  02/07/13  . Finger surgery      RIGHT THUMB   Social History   Social History  . Marital Status: Married    Spouse Name: N/A  . Number of Children: N/A  . Years of Education: N/A   Occupational History  . Claims Processor     Social History Main Topics  . Smoking status: Former Smoker -- 1.00 packs/day for 10 years    Quit date: 05/04/2000  . Smokeless tobacco: Never Used  . Alcohol Use: 0.0 oz/week    0 Standard drinks or equivalent per week     Comment: occ    . Drug Use: No  . Sexual Activity: Not Asked   Other Topics Concern  . None   Social History Narrative    Review of Systems  Constitutional: Negative for fever, chills, appetite change, fatigue and unexpected weight change.  HENT: Positive for ear pain (pressure) and tinnitus. Negative for congestion, postnasal drip, rhinorrhea, sinus pressure, sneezing, sore throat, trouble swallowing and voice change.   Eyes: Negative for visual disturbance.  Respiratory: Negative for shortness of breath.   Cardiovascular: Negative for chest pain and leg swelling.  Gastrointestinal: Negative for nausea, vomiting, abdominal pain, diarrhea and constipation.  Musculoskeletal: Negative for myalgias and arthralgias.  Skin: Negative for color change and rash.  Neurological: Positive for headaches. Negative for dizziness, tremors, weakness and light-headedness.  Hematological: Negative for adenopathy. Does not bruise/bleed easily.  Psychiatric/Behavioral: Negative for sleep disturbance and dysphoric mood. The patient is not nervous/anxious.        Objective:    BP 140/90 mmHg  Pulse 86  Ht 5\' 8"  (1.727 m)  Wt 235 lb 6.4 oz (106.777 kg)  BMI 35.80 kg/m2  SpO2 98% Physical Exam  Constitutional: She is oriented to person, place, and time. She appears well-developed and well-nourished. No distress.  HENT:  Head: Normocephalic and atraumatic.  Right Ear: External ear normal. Tympanic membrane is scarred.  Left Ear: Tympanic membrane and external ear normal.  Nose: Nose normal.  Mouth/Throat: Oropharynx is clear and moist. No oropharyngeal exudate.  Eyes: Conjunctivae are normal. Pupils are equal, round, and reactive to light. Right eye exhibits no discharge. Left eye exhibits no discharge. No scleral icterus.  Neck: Normal range of motion. Neck supple. No tracheal deviation present. No thyromegaly present.  Cardiovascular: Normal rate, regular rhythm, normal heart sounds and intact distal pulses.   Exam reveals no gallop and no friction rub.   No murmur heard. Pulmonary/Chest: Effort normal and breath sounds normal. No accessory muscle usage. No respiratory distress. She has no decreased breath sounds. She has no wheezes. She has no rhonchi. She has no rales. She exhibits no tenderness.  Musculoskeletal: Normal range of motion. She exhibits no edema or tenderness.  Lymphadenopathy:    She has no cervical adenopathy.  Neurological: She is alert and oriented to person, place, and time. No cranial nerve deficit. She exhibits normal muscle tone. Coordination normal.  Skin: Skin is warm and dry. No rash noted. She is not diaphoretic. No erythema. No pallor.  Psychiatric: She has a normal mood and affect. Her behavior is normal. Judgment and thought content normal.          Assessment & Plan:  Over 18min of which >50% spent in face-to-face contact with patient discussing plan of care  Problem List Items Addressed This Visit      Unprioritized   Essential hypertension, benign - Primary    BP Readings from Last 3 Encounters:  11/12/15 140/90  05/28/15 133/84  01/08/15 117/76   BP elevated. Discussed adding either Metoprolol or Amlodipine. She prefers to hold off for now. Will follow up after her ENT visit.      Relevant Orders   Comprehensive metabolic panel   CBC with Differential/Platelet   NASH (nonalcoholic steatohepatitis)    Repeat LFTs today. Encouraged follow up with [facility]. Encouraged diet low in carbohydrates.      Obesity    Wt Readings from Last 3 Encounters:  11/12/15 235 lb 6.4 oz (106.777 kg)  05/28/15 230 lb 6 oz (104.497 kg)  01/08/15 214 lb (97.07 kg)   Encouraged her to limit carbohydrate intake to 100gm daily. Encouraged exercise.       Other Visit Diagnoses    Routine general medical examination at a health care facility        Relevant Orders    TSH    Lipid panel    Hemoglobin A1c    Comprehensive metabolic panel    CBC with  Differential/Platelet        Return if symptoms worsen or fail to improve.  Ronette Deter, MD Internal [facility]

## 2015-11-12 NOTE — Assessment & Plan Note (Signed)
Repeat LFTs today. Encouraged follow up with [facility]. Encouraged diet low in carbohydrates.

## 2015-11-12 NOTE — Patient Instructions (Addendum)
Look into coverage for Saxenda.  We will follow up with lab results.  Email after you see ENT tomorrow.

## 2015-11-13 ENCOUNTER — Encounter: Payer: Self-pay | Admitting: Internal Medicine

## 2015-12-03 ENCOUNTER — Ambulatory Visit: Payer: 59 | Admitting: Internal Medicine

## 2016-07-05 ENCOUNTER — Encounter: Payer: Self-pay | Admitting: Gynecology

## 2016-07-05 ENCOUNTER — Ambulatory Visit
Admission: EM | Admit: 2016-07-05 | Discharge: 2016-07-05 | Disposition: A | Discharge status: Home / Self Care | Payer: 59 | Attending: Family Medicine | Admitting: Family Medicine

## 2016-07-05 DIAGNOSIS — R05 Cough: Secondary | ICD-10-CM | POA: Diagnosis not present

## 2016-07-05 DIAGNOSIS — J209 Acute bronchitis, unspecified: Secondary | ICD-10-CM | POA: Diagnosis not present

## 2016-07-05 DIAGNOSIS — R059 Cough, unspecified: Secondary | ICD-10-CM

## 2016-07-05 MED ORDER — AZITHROMYCIN 250 MG PO TABS: ORAL_TABLET | ORAL | 0 refills | Status: AC

## 2016-07-05 MED ORDER — HYDROCOD POLST-CPM POLST ER 10-8 MG/5ML PO SUER: 5.0000 mL | Freq: Two times a day (BID) | ORAL | 0 refills | Status: AC | PRN

## 2016-07-05 NOTE — ED Provider Notes (Signed)
[facility]    CSN: [id number] Arrival date & time: 07/05/16  1017     History   Chief Complaint Chief Complaint  Patient presents with  . Cough    HPI Rhonda Kirby is a 46 y.o. female.   Patient is a 54 show white female who had flulike symptoms on Monday last week. She was seen at [facility] primary care and diagnosed with probable have the flu. They did not have the flu swabs but I offered her Tamiflu on Wednesday though some concern because this is her first day of illness. She opted not to take the Tamiflu while she feels better now as far as the fever cough is still persistent and still until wet cough and she became concerned she came in to be seen and evaluated. History history hypertension hyperlipidemia hyperlipidemia bad liver and herpes genitalia. He stopped smoking about 15 years ago. Mother with hypertension and colonic polyps   The history is provided by the patient. No language interpreter was used.  Cough  Cough characteristics:  Non-productive and hacking Sputum characteristics:  Nondescript Onset quality:  Sudden Chronicity:  New Smoker: no   Context: upper respiratory infection   Relieved by:  Nothing Worsened by:  Nothing Ineffective treatments:  Fluids and cough suppressants Associated symptoms: chills, fever, sinus congestion and sore throat   Associated symptoms: no chest pain     Past Medical History:  Diagnosis Date  . Fatty liver   . 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 Herpes genitalis   . Hypertension   . Hypertriglyceridemia     Patient Active Problem List   Diagnosis Date Noted  . NASH (nonalcoholic steatohepatitis) 04/09/2014  . Glomus tumor 12/06/2013  . Hypertriglyceridemia 07/25/2012  . Essential hypertension, benign 07/25/2012  . Screening for breast cancer 06/23/2012  . Obesity 01/12/2011    Past Surgical History:  Procedure Laterality Date  . CESAREAN SECTION  2003, 2008  . CHOLECYSTECTOMY  02/07/13    . FINGER SURGERY     RIGHT THUMB  . TONSILLECTOMY  1976  . TUBAL LIGATION  2008    OB History    Gravida Para Term Preterm AB Living   4 2     2      SAB TAB Ectopic Multiple Live Births   2              Obstetric Comments   1st Menstrual Cycle:  12 1st Pregnancy:  18       Home Medications    Prior to Admission medications   Medication Sig Start Date End Date Taking? Authorizing Provider  ALPRAZolam (XANAX) 0.25 MG tablet Take 1 tablet (0.25 mg total) by mouth at bedtime as needed for sleep. 05/28/15  Yes Jackolyn Confer, MD  bifidobacterium infantis (ALIGN) capsule Take 1 capsule by mouth daily. 10/11/14  Yes Irene Shipper, MD  cetirizine (ZYRTEC) 10 MG tablet Take 10 mg by mouth daily.   Yes Historical Provider, MD  hydrochlorothiazide (MICROZIDE) 12.5 MG capsule Take 1 capsule (12.5 mg total) by mouth daily. 05/28/15  Yes Jackolyn Confer, MD  ibuprofen (ADVIL,MOTRIN) 200 MG tablet Take 200 mg by mouth every 6 (six) hours as needed for pain.   Yes Historical Provider, MD    Family History Family History  Problem Relation Age of Onset  . Hypertension Mother   . Colon polyps Mother   . Liver disease Mother   . Hypertension Maternal Grandmother   .  Colon polyps Maternal Grandmother   . Liver disease Maternal Grandmother   . Colon polyps Maternal Uncle     Social History Social History  Substance Use Topics  . Smoking status: Former Smoker    Packs/day: 1.00    Years: 10.00    Quit date: 05/04/2000  . Smokeless tobacco: Never Used  . Alcohol use 0.0 oz/week     Comment: occ     Allergies   Patient has no known allergies.   Review of Systems Review of Systems  Constitutional: Positive for chills and fever.  HENT: Positive for sore throat.   Respiratory: Positive for cough.   Cardiovascular: Negative for chest pain.  All other systems reviewed and are negative.    Physical Exam Triage Vital Signs ED Triage Vitals  Enc Vitals Group     BP 07/05/16  1041 (!) 130/101     Pulse Rate 07/05/16 1041 95     Resp 07/05/16 1041 16     Temp 07/05/16 1041 98 F (36.7 C)     Temp Source 07/05/16 1041 Oral     SpO2 07/05/16 1041 100 %     Weight 07/05/16 1043 225 lb (102.1 kg)     Height 07/05/16 1043 5\' 7"  (1.702 m)     Head Circumference --      Peak Flow --      Pain Score 07/05/16 1044 0     Pain Loc --      Pain Edu? --      Excl. in [place]? --    No data found.   Updated Vital Signs BP (!) 130/101 (BP Location: Left Arm)   Pulse 95   Temp 98 F (36.7 C) (Oral)   Resp 16   Ht 5\' 7"  (1.702 m)   Wt 225 lb (102.1 kg)   LMP 06/27/2016   SpO2 100%   BMI 35.24 kg/m   Visual Acuity Right Eye Distance:   Left Eye Distance:   Bilateral Distance:    Right Eye Near:   Left Eye Near:    Bilateral Near:     Physical Exam  Constitutional: She appears well-developed and well-nourished.  HENT:  Head: Normocephalic and atraumatic.  Right Ear: External ear normal.  Left Ear: External ear normal.  Eyes: EOM are normal. Pupils are equal, round, and reactive to light.  Neck: Normal range of motion. Neck supple.  Cardiovascular: Normal rate, regular rhythm and normal heart sounds.   Pulmonary/Chest: Effort normal and breath sounds normal.  Actively coughing  Musculoskeletal: Normal range of motion.  Neurological: She is alert.  Skin: Skin is warm.  Psychiatric: She has a normal mood and affect.  Vitals reviewed.    UC Treatments / Results  Labs (all labs ordered are listed, but only abnormal results are displayed) Labs Reviewed - No data to display  EKG  EKG Interpretation None       Radiology No results found.  Procedures Procedures (including critical care time)  Medications Ordered in UC Medications - No data to display   Initial Impression / Assessment and Plan / UC Course  I have reviewed the triage vital signs and the nursing notes.  Pertinent labs & imaging results that were available during my care of  the patient were reviewed by me and considered in my medical decision making (see chart for details).   ideas patient taking Tamiflu remember feeling much better and not have this secondary infection but because she didn't take Tamiflu will  treat with Zithromax Z-Pak and Tussionex 1 teaspoon twice a day for cough and give a work note for tomorrow emotionally and one half more for loss days.  Final Clinical Impressions(s) / UC Diagnoses   Final diagnoses:  None    New Prescriptions New Prescriptions   No medications on file     Frederich Cha, MD 07/05/16 1130

## 2016-07-05 NOTE — ED Triage Notes (Signed)
Patient c/o cough / chest congestion x 1 week. Per patient seen by [facility] x 1 week and was given a diagnose of FLU, however patient stated did not take the tamiflu that office request.

## 2019-07-21 ENCOUNTER — Ambulatory Visit: Payer: Self-pay | Attending: Internal Medicine

## 2019-07-21 DIAGNOSIS — Z23 Encounter for immunization: Secondary | ICD-10-CM

## 2019-07-21 NOTE — Progress Notes (Signed)
   Covid-19 Vaccination Clinic  Name:  Rhonda Kirby    MRN: [id number] DOB: [birth year]  07/21/2019  Ms. [name] was observed post Covid-19 immunization for 15 minutes without incident. She was provided with Vaccine Information Sheet and instruction to access the V-Safe system.   Ms. [name] was instructed to call 911 with any severe reactions post vaccine: Marland Kitchen Difficulty breathing  . Swelling of face and throat  . A fast heartbeat  . A bad rash all over body  . Dizziness and weakness   Immunizations Administered    Name Date Dose VIS Date Route   Pfizer COVID-19 Vaccine 07/21/2019  2:52 PM 0.3 mL 04/14/2019 Intramuscular   Manufacturer: [place]   Lot: [id number]   [place]: [id number]

## 2019-08-15 ENCOUNTER — Ambulatory Visit: Payer: Self-pay | Attending: Internal Medicine

## 2019-08-15 DIAGNOSIS — Z23 Encounter for immunization: Secondary | ICD-10-CM

## 2019-08-15 NOTE — Progress Notes (Signed)
   Covid-19 Vaccination Clinic  Name:  NEIMA DYER    MRN: [id number] DOB: 1970-06-16  08/15/2019  Ms. Gonzalo was observed post Covid-19 immunization for 15 minutes without incident. She was provided with Vaccine Information Sheet and instruction to access the V-Safe system.   Ms. Giffen was instructed to call 911 with any severe reactions post vaccine: Marland Kitchen Difficulty breathing  . Swelling of face and throat  . A fast heartbeat  . A bad rash all over body  . Dizziness and weakness   Immunizations Administered    Name Date Dose VIS Date Route   Pfizer COVID-19 Vaccine 08/15/2019  3:32 PM 0.3 mL 04/14/2019 Intramuscular   Manufacturer: [place]   Lot: [id number]   [place]: [id number]

## 2020-06-11 ENCOUNTER — Encounter: Payer: Self-pay | Admitting: Internal Medicine

## 2022-08-06 ENCOUNTER — Encounter: Admission: RE | Payer: Self-pay | Source: Home / Self Care

## 2022-08-06 ENCOUNTER — Ambulatory Visit
Admission: RE | Admit: 2022-08-06 | Payer: Commercial Managed Care - PPO | Source: Home / Self Care | Admitting: Podiatry

## 2022-08-06 SURGERY — EXCISION MASS
Anesthesia: Choice | Laterality: Right

## 2024-05-31 ENCOUNTER — Telehealth: Payer: Self-pay

## 2024-05-31 NOTE — Telephone Encounter (Signed)
 Thank you. Returned patients call to schedule her colonoscopy based on referral in epic dated 05/11/24.  Left voice message for her to call office back to schedule.    Rosaline, CMA

## 2024-05-31 NOTE — Telephone Encounter (Signed)
 Pt is requesting call back to cancel and reschedule procedure (colonoscopy)

## 2024-06-09 NOTE — Telephone Encounter (Signed)
 The patient stated that her provider informed her she does not need the procedure until 2027. She requested to speak with Rosaline regarding this matter. The patient was advised that the referral was received from her PCP.
